# Patient Record
Sex: Female | Born: 1985 | Race: Black or African American | Hispanic: No | Marital: Single | Smoking: Never smoker
Health system: Southern US, Community
[De-identification: ages and names within clinical notes are randomized; demographics above are authoritative.]

## PROBLEM LIST (undated history)

## (undated) DIAGNOSIS — R011 Cardiac murmur, unspecified: Secondary | ICD-10-CM

## (undated) DIAGNOSIS — D649 Anemia, unspecified: Secondary | ICD-10-CM

## (undated) DIAGNOSIS — I10 Essential (primary) hypertension: Secondary | ICD-10-CM

## (undated) HISTORY — PX: NO PAST SURGERIES: SHX2092

## (undated) HISTORY — PX: DILATION AND CURETTAGE OF UTERUS: SHX78

---

## 2007-03-19 ENCOUNTER — Ambulatory Visit: Payer: Self-pay | Admitting: Gynecology

## 2007-03-19 ENCOUNTER — Encounter (INDEPENDENT_AMBULATORY_CARE_PROVIDER_SITE_OTHER): Payer: Self-pay | Admitting: Gynecology

## 2007-03-19 ENCOUNTER — Encounter (INDEPENDENT_AMBULATORY_CARE_PROVIDER_SITE_OTHER): Payer: Self-pay | Admitting: Specialist

## 2007-07-15 ENCOUNTER — Encounter: Payer: Self-pay | Admitting: Obstetrics and Gynecology

## 2007-07-15 ENCOUNTER — Ambulatory Visit: Payer: Self-pay | Admitting: Obstetrics and Gynecology

## 2007-07-15 ENCOUNTER — Other Ambulatory Visit: Admission: RE | Admit: 2007-07-15 | Discharge: 2007-07-15 | Payer: Self-pay | Admitting: Obstetrics and Gynecology

## 2007-08-20 ENCOUNTER — Ambulatory Visit: Payer: Self-pay | Admitting: *Deleted

## 2007-11-01 ENCOUNTER — Inpatient Hospital Stay (HOSPITAL_COMMUNITY): Admission: AD | Admit: 2007-11-01 | Discharge: 2007-11-01 | Payer: Self-pay | Admitting: Obstetrics & Gynecology

## 2008-02-18 ENCOUNTER — Ambulatory Visit: Payer: Self-pay | Admitting: Obstetrics and Gynecology

## 2008-02-18 ENCOUNTER — Encounter: Payer: Self-pay | Admitting: Obstetrics and Gynecology

## 2010-07-20 ENCOUNTER — Emergency Department (HOSPITAL_COMMUNITY): Admission: EM | Admit: 2010-07-20 | Discharge: 2010-07-21 | Payer: Self-pay | Admitting: Emergency Medicine

## 2011-03-01 LAB — DIFFERENTIAL
Basophils Absolute: 0 10*3/uL (ref 0.0–0.1)
Basophils Relative: 0 % (ref 0–1)
Eosinophils Absolute: 0 10*3/uL (ref 0.0–0.7)
Eosinophils Relative: 0 % (ref 0–5)
Lymphs Abs: 0.6 10*3/uL — ABNORMAL LOW (ref 0.7–4.0)
Neutro Abs: 15.1 10*3/uL — ABNORMAL HIGH (ref 1.7–7.7)
Neutrophils Relative %: 93 % — ABNORMAL HIGH (ref 43–77)

## 2011-03-01 LAB — CBC
HCT: 32 % — ABNORMAL LOW (ref 36.0–46.0)
Hemoglobin: 10.5 g/dL — ABNORMAL LOW (ref 12.0–15.0)
MCH: 25.9 pg — ABNORMAL LOW (ref 26.0–34.0)
MCHC: 32.7 g/dL (ref 30.0–36.0)
MCV: 79.1 fL (ref 78.0–100.0)
Platelets: 350 10*3/uL (ref 150–400)
RDW: 16.6 % — ABNORMAL HIGH (ref 11.5–15.5)
WBC: 16.2 10*3/uL — ABNORMAL HIGH (ref 4.0–10.5)

## 2011-03-01 LAB — URINE MICROSCOPIC-ADD ON

## 2011-03-01 LAB — BASIC METABOLIC PANEL
BUN: 10 mg/dL (ref 6–23)
GFR calc Af Amer: >60 mL/min (ref >60)

## 2011-03-01 LAB — URINALYSIS, ROUTINE W REFLEX MICROSCOPIC: Nitrite: NEGATIVE

## 2011-03-01 LAB — GC/CHLAMYDIA PROBE AMP, GENITAL: GC Probe Amp, Genital: NEGATIVE

## 2011-04-30 NOTE — Group Therapy Note (Signed)
NAME:  Kimberly [name], Kimberly [name] NO.:  [id number]   MEDICAL RECORD NO.:  [id number]          PATIENT TYPE:  WOC   LOCATION:  [facility]                   FACILITY:  [facility]   PHYSICIAN:  Argentina Donovan, MD        DATE OF BIRTH:  1986-09-09   DATE OF SERVICE:  02/18/2008                                  CLINIC NOTE   The patient is a 25 year old African American female who underwent  cryosurgery 6 months ago because of a severely abnormal Pap smear.  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  We did conservative cryosurgery because of the patient's age, reporting  it again today, and pending on if it is abnormal, we will probably  follow up with a more radical approach.  If it is normal, we will follow  her up in 6 months with another Pap smear.   IMPRESSION:  Severe cervical dysplasia.           ______________________________  Argentina Donovan, MD     PR/MEDQ  D:  02/18/2008  T:  02/18/2008  Job:  [id number]

## 2011-05-01 IMAGING — CT CT ABD-PELV W/ CM
1 of 2 series · 16 of 32 positions shown, 20 images · IV contrast (agent unspecified)
Comparison: None

CLINICAL DATA: Abdominal pain.

CT ABDOMEN AND PELVIS WITH CONTRAST
TECHNIQUE: Multidetector CT imaging of the abdomen and pelvis was
performed following the standard protocol during bolus
administration of intravenous contrast.
Contrast: 100 ml Imnipaque-FPP IV.

[Series 2: rtn ap with st · axial · 0.72mm/px · z∈[-463,-53]mm · 16 of 90 slices shown, 20 images]
[im 4/90  soft-tissue]
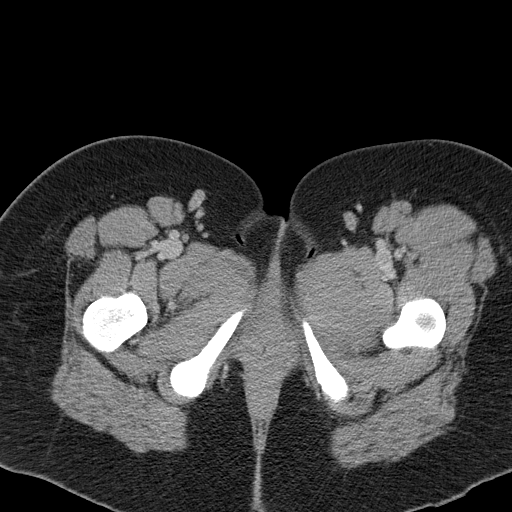
[im 4/90  bone]
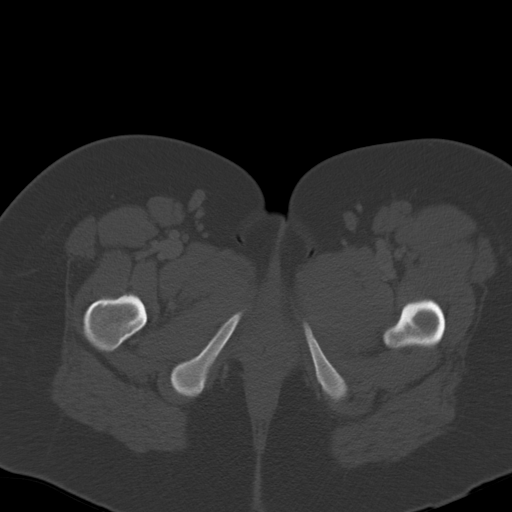
[im 11/90  soft-tissue]
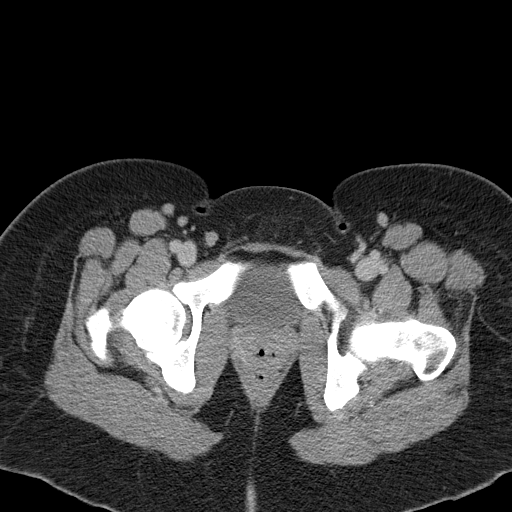
[im 18/90  soft-tissue]
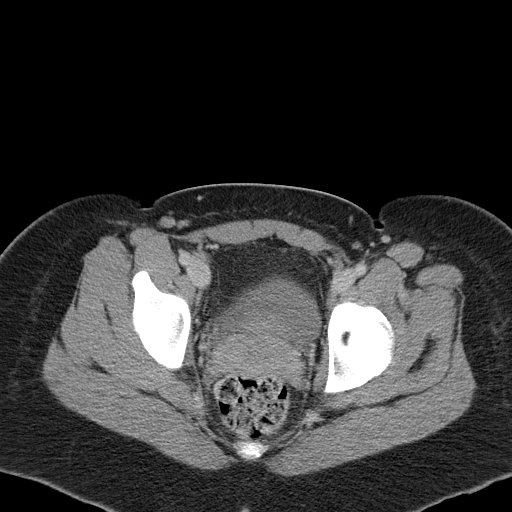
[im 25/90  soft-tissue]
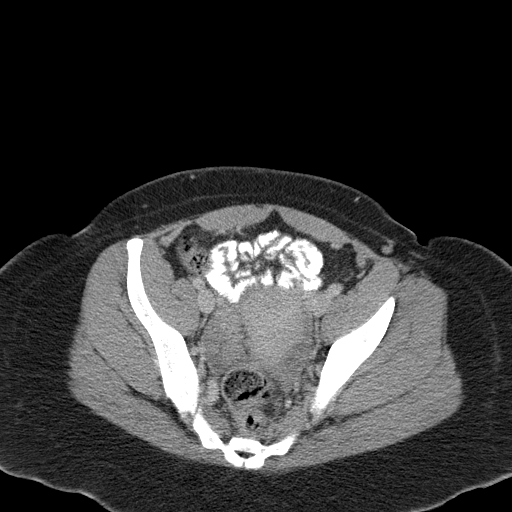
[im 29/90  soft-tissue]
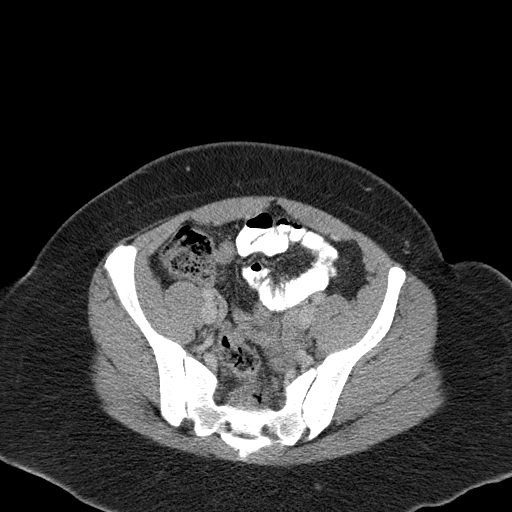
[im 36/90  soft-tissue]
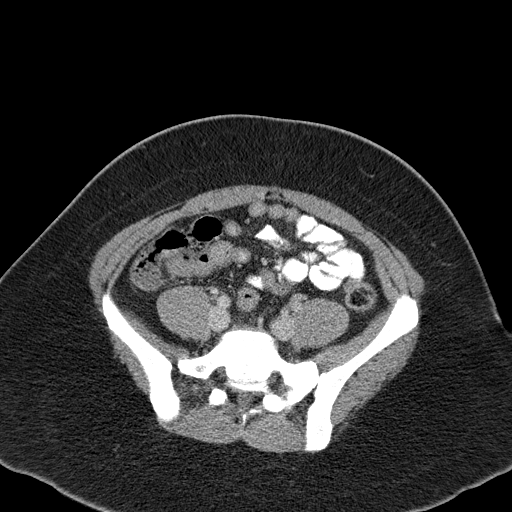
[im 43/90  soft-tissue]
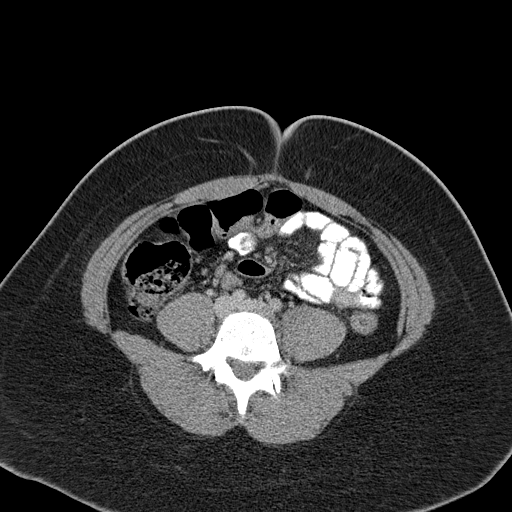
[im 47/90  soft-tissue]
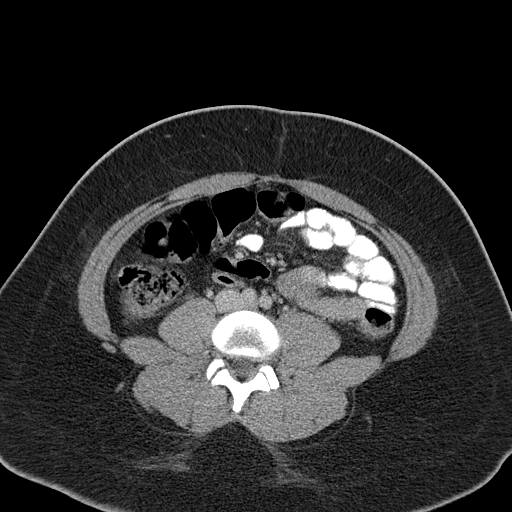
[im 54/90  soft-tissue]
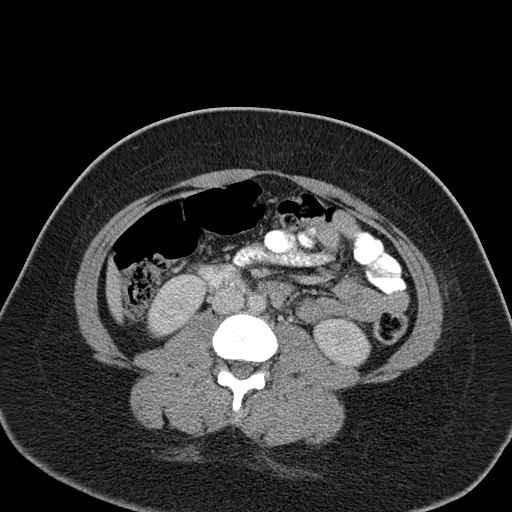
[im 54/90  bone]
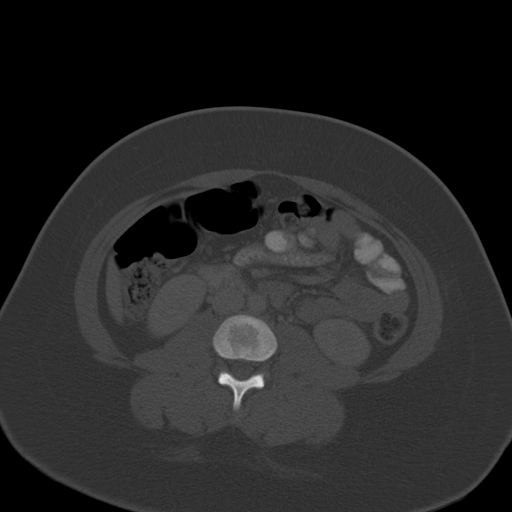
[im 61/90  soft-tissue]
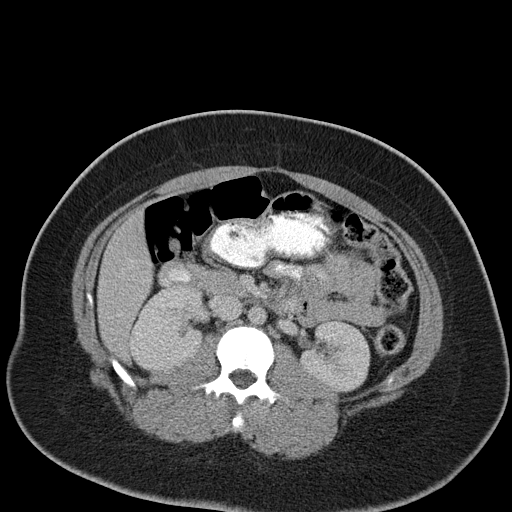
[im 68/90  soft-tissue]
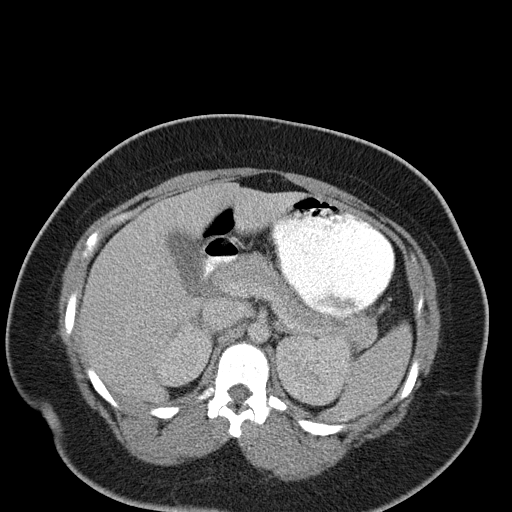
[im 72/90  soft-tissue]
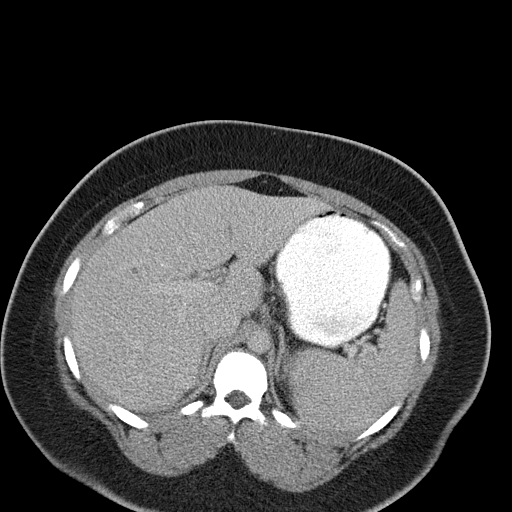
[im 75/90  lung]
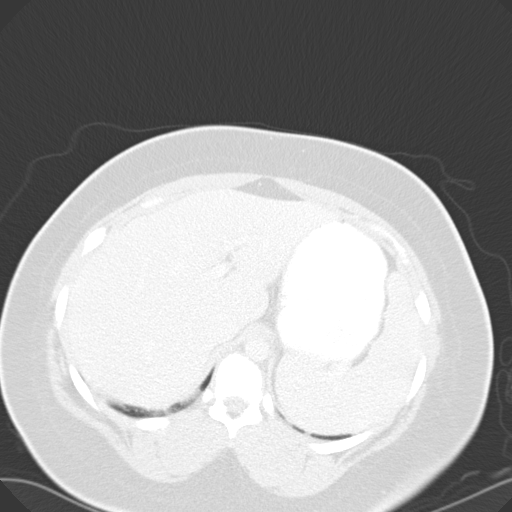
[im 79/90  soft-tissue]
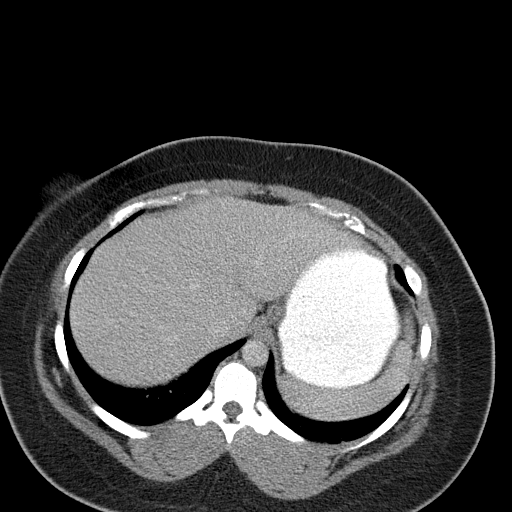
[im 79/90  lung]
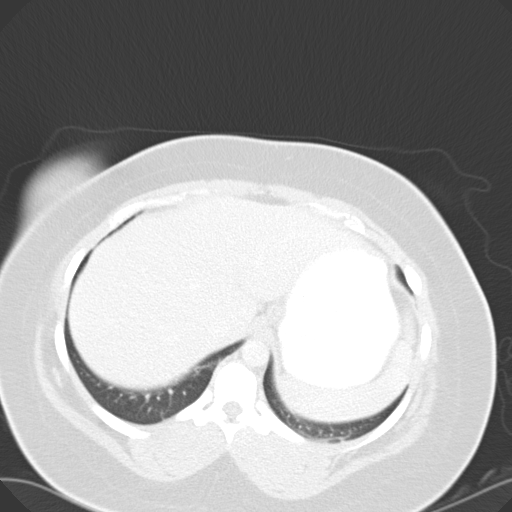
[im 82/90  lung]
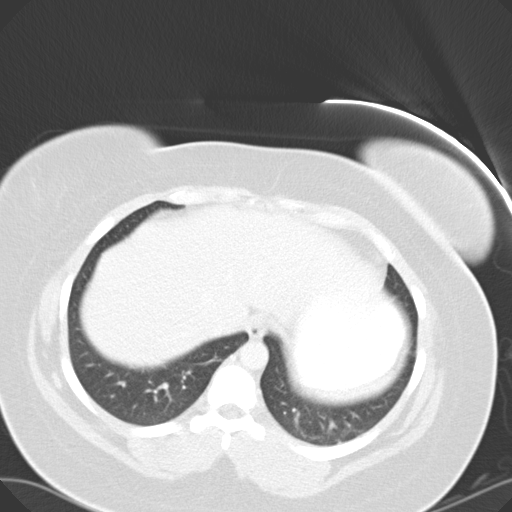
[im 86/90  soft-tissue]
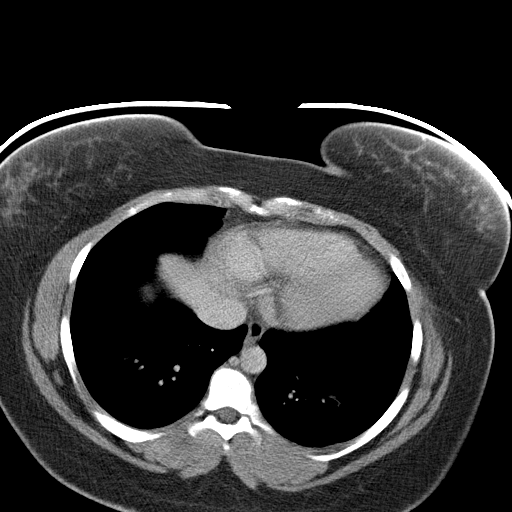
[im 86/90  lung]
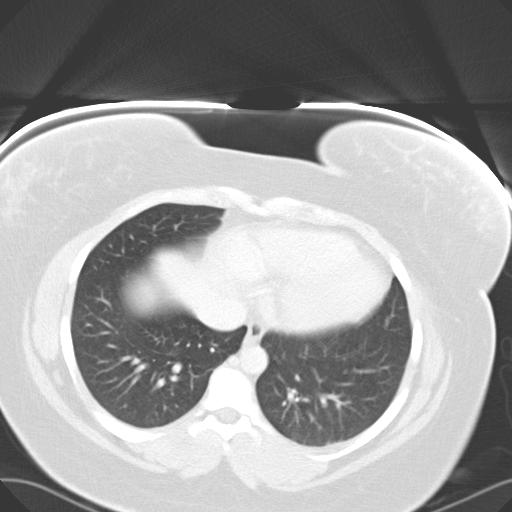

[16 of 32 positions shown; findings below may reference images not displayed]

FINDINGS: The liver and gallbladder normal.  The pancreas spleen
and kidneys are normal.

There is no bowel obstruction or bowel thickening.  The appendix is
normal.

There is mild free fluid in the pelvis surrounding the adnexa
bilaterally.  The uterus is normal in size.  There is no mass
lesion.  No adenopathy is present.
IMPRESSION: Free fluid in the pelvis.  This may be due to a hemorrhagic ovarian
cyst or possibly PID.  Otherwise negative.

## 2011-09-24 LAB — URINALYSIS, ROUTINE W REFLEX MICROSCOPIC
Protein, ur: 30 — AB
Urobilinogen, UA: 0.2
pH: 7

## 2011-09-24 LAB — POCT PREGNANCY, URINE: Operator id: 251141

## 2011-09-24 LAB — URINE MICROSCOPIC-ADD ON: RBC / HPF: NONE SEEN

## 2011-09-30 LAB — POCT PREGNANCY, URINE: Operator id: 297281

## 2012-04-21 ENCOUNTER — Ambulatory Visit: Payer: 59 | Admitting: Physician Assistant

## 2012-04-21 VITALS — BP 119/85 | HR 99 | Temp 98.4°F | Resp 18 | Ht 63.5 in | Wt 254.6 lb

## 2012-04-21 DIAGNOSIS — J069 Acute upper respiratory infection, unspecified: Secondary | ICD-10-CM

## 2012-04-21 DIAGNOSIS — D649 Anemia, unspecified: Secondary | ICD-10-CM

## 2012-04-21 DIAGNOSIS — R109 Unspecified abdominal pain: Secondary | ICD-10-CM

## 2012-04-21 LAB — POCT UA - MICROSCOPIC ONLY: Crystals, Ur, HPF, POC: NEGATIVE

## 2012-04-21 LAB — POCT CBC
Granulocyte percent: 77.7 %G (ref 37–80)
MCV: 74.1 fL — AB (ref 80–97)
RBC: 4.61 M/uL (ref 4.04–5.48)

## 2012-04-21 LAB — POCT URINALYSIS DIPSTICK
Bilirubin, UA: NEGATIVE
Blood, UA: NEGATIVE
Glucose, UA: NEGATIVE
Protein, UA: NEGATIVE
Spec Grav, UA: 1.02
pH, UA: 6.5

## 2012-04-21 MED ORDER — FLUTICASONE PROPIONATE 50 MCG/ACT NA SUSP: 2.0000 | Freq: Every day | NASAL | Status: DC

## 2012-04-21 MED ORDER — NAPROXEN 500 MG PO TABS: 500.0000 mg | ORAL_TABLET | Freq: Two times a day (BID) | ORAL | Status: DC

## 2012-04-21 NOTE — Progress Notes (Signed)
Subjective:    Patient ID: Kimberly [name], female    DOB: 1986-10-08, 26 y.o.   MRN: [id number]  HPI26yo AAF here with severe nasal congestion X3days.  No f/c.  Initially had a slightly ST.  No cough.  +sneezing.  No bodyaches.  Also c/o 2 day h/o L flank pain.  Denies urinary/pelvic s/sx. No new activity. No h/o kidney stones.  Appetite is normal.  No N/V/D.    Review of Systems  All other systems reviewed and are negative.       Objective:   Physical Exam  Nursing note and vitals reviewed. Constitutional: She is oriented to person, place, and time. She appears well-developed and well-nourished.  HENT:  Head: Normocephalic and atraumatic.  Right Ear: External ear normal.  Left Ear: External ear normal.  Mouth/Throat: No oropharyngeal exudate (pnd).       Turbinates inflamed,red, and swollen  Cardiovascular: Normal rate, regular rhythm and normal heart sounds.   Pulmonary/Chest: Effort normal and breath sounds normal. No respiratory distress. She has no wheezes. She has no rales. She exhibits no tenderness.  Abdominal: Soft. Bowel sounds are normal. She exhibits no distension. There is tenderness (mild LUQ midaxillary TTP).  Neurological: She is alert and oriented to person, place, and time.  Skin: Skin is warm and dry.   Results for orders placed in visit on 04/21/12  POCT URINALYSIS DIPSTICK      Component Value Range   Color, UA yellow     Clarity, UA clear     Glucose, UA neg     Bilirubin, UA neg     Ketones, UA neg     Spec Grav, UA 1.020     Blood, UA neg     pH, UA 6.5     Protein, UA neg     Urobilinogen, UA 0.2     Nitrite, UA neg     Leukocytes, UA Negative    POCT UA - MICROSCOPIC ONLY      Component Value Range   WBC, Ur, HPF, POC 0-1     RBC, urine, microscopic neg     Bacteria, U Microscopic trace     Mucus, UA neg     Epithelial cells, urine per micros 0-4     Crystals, Ur, HPF, POC neg     Casts, Ur, LPF, POC neg     Yeast, UA neg    POCT CBC   Component Value Range   WBC 9.8  4.6 - 10.2 (K/uL)   Lymph, poc 1.7  0.6 - 3.4    POC LYMPH PERCENT 16.9  10 - 50 (%L)   MID (cbc) 0.5  0 - 0.9    POC MID % 5.4  0 - 12 (%M)   POC Granulocyte 7.6 (*) 2 - 6.9    Granulocyte percent 77.7  37 - 80 (%G)   RBC 4.61  4.04 - 5.48 (M/uL)   Hemoglobin 10.6 (*) 12.2 - 16.2 (g/dL)   HCT, POC 40.9 (*) 81.1 - 47.9 (%)   MCV 74.1 (*) 80 - 97 (fL)   MCH, POC 23.0 (*) 27 - 31.2 (pg)   MCHC 31.0 (*) 31.8 - 35.4 (g/dL)   RDW, POC 91.4     Platelet Count, POC 393  142 - 424 (K/uL)   MPV 8.6  0 - 99.8 (fL)          Assessment & Plan:  URI-viral Anemia-start slow Fe 1 daily.  Recheck in 8-10 weeks.  Increase water intake. L flank pain-non acute abdomen-likely musculoskeletal or splenic flexure syndrome. See meds.  Also advised mucinex D

## 2012-06-30 ENCOUNTER — Ambulatory Visit (INDEPENDENT_AMBULATORY_CARE_PROVIDER_SITE_OTHER): Payer: 59 | Admitting: Physician Assistant

## 2012-06-30 VITALS — BP 132/88 | HR 80 | Temp 98.5°F | Resp 16 | Ht 64.0 in | Wt 262.0 lb

## 2012-06-30 DIAGNOSIS — E669 Obesity, unspecified: Secondary | ICD-10-CM

## 2012-06-30 DIAGNOSIS — R51 Headache: Secondary | ICD-10-CM

## 2012-06-30 MED ORDER — PREDNISONE 10 MG PO TABS: ORAL_TABLET | ORAL | Status: DC

## 2012-06-30 NOTE — Progress Notes (Signed)
  Subjective:    Patient ID: Kimberly [name], female    DOB: 20-Mar-1986, 26 y.o.   MRN: [id number]  HPI  Pt presents to clinic with concerns regarding daily HAs.  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  They are mainly L sided behind the eye without nausea but with photophobia.  She gets 4-5 each week and many of them send her home from work.  She has gone home both days this week.  She takes Advil for the headaches (5-7 at a time) most days and gets about 30 mins of relief and then the HA returns sometimes worse.  She most days goes to bed with HA and then wakes up with the pain also.  She wears glasses for computer work but does not always do it - she thought that might be a problem so she started to wear her glasses but that did not make much of a difference - she rarely eats breakfast and does not feel the HAs are related to her eating or not eating.  They are not related to her menses.  Her mother has H/o migraines.  She has never been evaluated for these HAs but before 3 months ago did not have them.  Has h/o seasonal allergies was on Nasonex but stopped that about 1 month ago and her allergies have stayed in control.   Review of Systems  Constitutional: Negative for fever and chills.  Eyes: Positive for photophobia.  Gastrointestinal: Negative for nausea.  Neurological: Positive for headaches. Negative for dizziness and weakness.       Objective:   Physical Exam  Vitals reviewed. Constitutional: She is oriented to person, place, and time. She appears well-developed and well-nourished.  HENT:  Head: Normocephalic and atraumatic.  Right Ear: External ear normal.  Left Ear: External ear normal.  Nose: Nose normal.  Mouth/Throat: Oropharynx is clear and moist.  Eyes: Conjunctivae and EOM are normal. Pupils are equal, round, and reactive to light.  Neck: Normal range of motion. Neck supple.    Cardiovascular: Normal rate, regular rhythm and normal heart sounds.  Exam reveals no gallop and no friction rub.   No murmur heard. Pulmonary/Chest: Effort normal and breath sounds normal.  Neurological: She is alert and oriented to person, place, and time. She has normal reflexes.  Skin: Skin is warm and dry.  Psychiatric: She has a normal mood and affect. Her behavior is normal. Judgment and thought content normal.          Assessment & Plan:   1. Persistent headaches  predniSONE (DELTASONE) 10 MG tablet   D/w pt at length daily chronic HA and the different ways to treat.  We decided to try Advil complete withdraw for at least 2 wks and do a prednisone taper to help with HAs.  Then will start a HA diary.  I feel like these HAs are probably multifactorial - work, vision, migraines overuse of medication.  If no improvement in HA after withdraw will start topamax.  Pt understood and agreed with the above plan.

## 2013-01-04 ENCOUNTER — Ambulatory Visit (INDEPENDENT_AMBULATORY_CARE_PROVIDER_SITE_OTHER): Payer: Managed Care, Other (non HMO) | Admitting: Family Medicine

## 2013-01-04 VITALS — BP 138/84 | HR 101 | Temp 98.5°F | Resp 16 | Ht 63.0 in | Wt 251.0 lb

## 2013-01-04 DIAGNOSIS — Z0000 Encounter for general adult medical examination without abnormal findings: Secondary | ICD-10-CM

## 2013-01-04 LAB — POCT URINALYSIS DIPSTICK
Bilirubin, UA: NEGATIVE
Blood, UA: NEGATIVE
Glucose, UA: NEGATIVE
Leukocytes, UA: NEGATIVE
Nitrite, UA: NEGATIVE
Protein, UA: 30
Spec Grav, UA: >=1.03
Urobilinogen, UA: 0.2
pH, UA: 5.5

## 2013-01-04 LAB — POCT CBC
Granulocyte percent: 73.4 %G (ref 37–80)
HCT, POC: 32.7 % — AB (ref 37.7–47.9)
Hemoglobin: 9.7 g/dL — AB (ref 12.2–16.2)
Lymph, poc: 2.2 (ref 0.6–3.4)
MCH, POC: 22.7 pg — AB (ref 27–31.2)
MCHC: 29.7 g/dL — AB (ref 31.8–35.4)
MCV: 76.6 fL — AB (ref 80–97)
MID (cbc): 0.5 (ref 0–0.9)
MPV: 8.1 fL (ref 0–99.8)
POC Granulocyte: 7.3 — AB (ref 2–6.9)
POC LYMPH PERCENT: 21.8 %L (ref 10–50)
POC MID %: 4.8 %M (ref 0–12)
Platelet Count, POC: 465 10*3/uL — AB (ref 142–424)
RBC: 4.27 M/uL (ref 4.04–5.48)
RDW, POC: 15.4 %
WBC: 9.9 10*3/uL (ref 4.6–10.2)

## 2013-01-04 NOTE — Progress Notes (Signed)
Patient ID: Kimberly [name] MRN: [id number], DOB: August 26, 1986, 27 y.o. Date of Encounter: 01/04/2013, 5:13 PM  Primary Physician: No primary provider on file.  Chief Complaint: Physical (CPE)  HPI: 27 y.o. y/o female with history of noted below here for CPE.  Doing well. No issues/complaints. Works at call center Works out at [employer] up to 2 days a week  LMP: December 29, 2012 Pap: year ago  Review of Systems: Consitutional: No fever, chills, fatigue, night sweats, lymphadenopathy, or weight changes. Eyes: No visual changes, eye redness, or discharge. ENT/Mouth: Ears: No otalgia, tinnitus, hearing loss, discharge. Nose: No congestion, rhinorrhea, sinus pain, or epistaxis. Throat: No sore throat, post nasal drip, or teeth pain. Cardiovascular: No CP, palpitations, diaphoresis, DOE, edema, orthopnea, PND. Respiratory: No cough, hemoptysis, SOB, or wheezing. Gastrointestinal: No anorexia, dysphagia, reflux, pain, nausea, vomiting, hematemesis, diarrhea, constipation, BRBPR, or melena. Breast: No discharge, pain, swelling, or mass. Genitourinary: No dysuria, frequency, urgency, hematuria, incontinence, nocturia, amenorrhea, vaginal discharge, pruritis, burning, abnormal bleeding, or pain. Musculoskeletal: No decreased ROM, myalgias, stiffness, joint swelling, or weakness. Skin: No rash, erythema, lesion changes, pain, warmth, jaundice, or pruritis. Neurological: No headache, dizziness, syncope, seizures, tremors, memory loss, coordination problems, or paresthesias. Psychological: No anxiety, depression, hallucinations, SI/HI. Endocrine: No fatigue, polydipsia, polyphagia, polyuria, or known diabetes. All other systems were reviewed and are otherwise negative.  History reviewed. No pertinent past medical history.   History reviewed. No pertinent past surgical history.  Home Meds:  Prior to Admission medications   Medication Sig Start Date End Date Taking? Authorizing Provider    fluticasone (FLONASE) 50 MCG/ACT nasal spray Place 2 sprays into the nose daily. 04/21/12 04/21/13  Anders Simmonds, PA-C  HPV Quadrivalent Vaccine (GARDASIL IM) Inject into the muscle.    Historical Provider, MD  naproxen (NAPROSYN) 500 MG tablet Take 1 tablet (500 mg total) by mouth 2 (two) times daily with a meal. X5days then prn pain 04/21/12 04/21/13  Anders Simmonds, PA-C  predniSONE (DELTASONE) 10 MG tablet 6-1 taper - take the days meds early in day all at once with food 06/30/12   Morrell Riddle, PA-C    Allergies: No Known Allergies  History   Social History  . Marital Status: Single    Spouse Name: N/A    Number of Children: N/A  . Years of Education: N/A   Occupational History  . Not on file.   Social History Main Topics  . Smoking status: Never Smoker   . Smokeless tobacco: Not on file  . Alcohol Use: Not on file  . Drug Use: Not on file  . Sexually Active: Not on file   Other Topics Concern  . Not on file   Social History Narrative  . No narrative on file    History reviewed. No pertinent family history.  Physical Exam: Blood pressure 138/84, pulse 101, temperature 98.5 F (36.9 C), temperature source Oral, resp. rate 16, height 5\' 3"  (1.6 m), weight 251 lb (113.853 kg), last menstrual period 12/29/2012, SpO2 96.00%., Body mass index is 44.46 kg/(m^2). General: Well developed, well nourished, in no acute distress. HEENT: Normocephalic, atraumatic. Conjunctiva pink, sclera non-icteric. Pupils 2 mm constricting to 1 mm, round, regular, and equally reactive to light and accomodation. EOMI. Internal auditory canal clear. TMs with good cone of light and without pathology. Nasal mucosa pink. Nares are without discharge. No sinus tenderness. Oral mucosa pink. Dentition good. Pharynx without exudate.   Neck: Supple. Trachea midline. No thyromegaly. Full  ROM. No lymphadenopathy. Lungs: Clear to auscultation bilaterally without wheezes, rales, or rhonchi. Breathing is of normal  effort and unlabored. Cardiovascular: RRR with S1 S2. No murmurs, rubs, or gallops appreciated. Distal pulses 2+ symmetrically. No carotid or abdominal bruits. Breast: Symmetrical. No masses. Nipples without discharge. Abdomen: Soft, non-tender, non-distended with normoactive bowel sounds. No hepatosplenomegaly or masses. No rebound/guarding. No CVA tenderness. Without hernias.  Genitourinary:  External genitalia without lesions. Vaginal mucosa pink. Cervix pink and without discharge. No cervical or adnexal tenderness. Pap smear taken Musculoskeletal: Full range of motion and 5/5 strength throughout. Without swelling, atrophy, tenderness, crepitus, or warmth. Extremities without clubbing, cyanosis, or edema. Calves supple. Skin: Warm and moist without erythema, ecchymosis, wounds, or rash. Neuro: A+Ox3. CN II-XII grossly intact. Moves all extremities spontaneously. Full sensation throughout. Normal gait. DTR 2+ throughout upper and lower extremities. Finger to nose intact. Psych:  Responds to questions appropriately with a normal affect.   Studies: CBC, CMET, Lipid UA:  Results for orders placed in visit on 01/04/13  POCT CBC      Component Value Range   WBC 9.9  4.6 - 10.2 K/uL   Lymph, poc 2.2  0.6 - 3.4   POC LYMPH PERCENT 21.8  10 - 50 %L   MID (cbc) 0.5  0 - 0.9   POC MID % 4.8  0 - 12 %M   POC Granulocyte 7.3 (*) 2 - 6.9   Granulocyte percent 73.4  37 - 80 %G   RBC 4.27  4.04 - 5.48 M/uL   Hemoglobin 9.7 (*) 12.2 - 16.2 g/dL   HCT, POC 40.9 (*) 81.1 - 47.9 %   MCV 76.6 (*) 80 - 97 fL   MCH, POC 22.7 (*) 27 - 31.2 pg   MCHC 29.7 (*) 31.8 - 35.4 g/dL   RDW, POC 91.4     Platelet Count, POC 465 (*) 142 - 424 K/uL   MPV 8.1  0 - 99.8 fL  POCT URINALYSIS DIPSTICK      Component Value Range   Color, UA yellow     Clarity, UA cloudy     Glucose, UA neg     Bilirubin, UA neg     Ketones, UA trace     Spec Grav, UA >=1.030     Blood, UA neg     pH, UA 5.5     Protein, UA 30      Urobilinogen, UA 0.2     Nitrite, UA neg     Leukocytes, UA Negative       Assessment/Plan:  27 y.o. y/o female here for CPE Overweight, but otherwise healthy 1. Annual physical exam  POCT CBC, Comprehensive metabolic panel, Lipid panel, Pap IG, CT/NG w/ reflex HPV when ASC-U, POCT urinalysis dipstick    -  Signed, Elvina Sidle, MD 01/04/2013 5:13 PM

## 2013-01-04 NOTE — Patient Instructions (Addendum)
Obesity Obesity is defined as having too much total body fat and a body mass index (BMI) of 30 or more. BMI is an estimate of body fat and is calculated from your height and weight. Obesity happens when you consume more calories than you can burn by exercising or performing daily physical tasks. Prolonged obesity can cause major illnesses or emergencies, such as:   A stroke.  Heart disease.  Diabetes.  Cancer.  Arthritis.  High blood pressure (hypertension).  High cholesterol.  Sleep apnea.  Erectile dysfunction.  Infertility problems. CAUSES   Regularly eating unhealthy foods.  Physical inactivity.  Certain disorders, such as an underactive thyroid (hypothyroidism), Cushing's syndrome, and polycystic ovarian syndrome.  Certain medicines, such as steroids, some depression medicines, and antipsychotics.  Genetics.  Lack of sleep. DIAGNOSIS  A caregiver can diagnose obesity after calculating your BMI. Obesity will be diagnosed if your BMI is 30 or higher.  There are other methods of measuring obesity levels. Some other methods include measuring your skin fold thickness, your waist circumference, and comparing your hip circumference to your waist circumference. TREATMENT  A healthy treatment program includes some or all of the following:  Long-term dietary changes.  Exercise and physical activity.  Behavioral and lifestyle changes.  Medicine only under the supervision of your caregiver. Medicines may help, but only if they are used with diet and exercise programs. An unhealthy treatment program includes:  Fasting.  Fad diets.  Supplements and drugs. These choices do not succeed in long-term weight control.  HOME CARE INSTRUCTIONS   Exercise and perform physical activity as directed by your caregiver. To increase physical activity, try the following:  Use stairs instead of elevators.  Park farther away from store entrances.  Garden, bike, or walk instead of  watching television or using the computer.  Eat healthy, low-calorie foods and drinks on a regular basis. Eat more fruits and vegetables. Use low-calorie cookbooks or take healthy cooking classes.  Limit fast food, sweets, and processed snack foods.  Eat smaller portions.  Keep a daily journal of everything you eat. There are many free websites to help you with this. It may be helpful to measure your foods so you can determine if you are eating the correct portion sizes.  Avoid drinking alcohol. Drink more water and drinks without calories.  Take vitamins and supplements only as recommended by your caregiver.  Weight-loss support groups, Optometrist, counselors, and stress reduction education can also be very helpful. SEEK IMMEDIATE MEDICAL CARE IF:  You have chest pain or tightness.  You have trouble breathing or feel short of breath.  You have weakness or leg numbness.  You feel confused or have trouble talking.  You have sudden changes in your vision. MAKE SURE YOU:  Understand these instructions.  Will watch your condition.  Will get help right away if you are not doing well or get worse. Document Released: 01/09/2005 Document Revised: 06/02/2012 Document Reviewed: 01/08/2012 [facility] Patient Information 2013 [place], [place]. Health Maintenance, Females A healthy lifestyle and preventative care can promote health and wellness.  Maintain regular health, dental, and eye exams.  Eat a healthy diet. Foods like vegetables, fruits, whole grains, low-fat dairy products, and lean protein foods contain the nutrients you need without too many calories. Decrease your intake of foods high in solid fats, added sugars, and salt. Get information about a proper diet from your caregiver, if necessary.  Regular physical exercise is one of the most important things you can do for  your health. Most adults should get at least 150 minutes of moderate-intensity exercise (any activity  that increases your heart rate and causes you to sweat) each week. In addition, most adults need muscle-strengthening exercises on 2 or more days a week.   Maintain a healthy weight. The body mass index (BMI) is a screening tool to identify possible weight problems. It provides an estimate of body fat based on height and weight. Your caregiver can help determine your BMI, and can help you achieve or maintain a healthy weight. For adults 20 years and older:  A BMI below 18.5 is considered underweight.  A BMI of 18.5 to 24.9 is normal.  A BMI of 25 to 29.9 is considered overweight.  A BMI of 30 and above is considered obese.  Maintain normal blood lipids and cholesterol by exercising and minimizing your intake of saturated fat. Eat a balanced diet with plenty of fruits and vegetables. Blood tests for lipids and cholesterol should begin at age 29 and be repeated every 5 years. If your lipid or cholesterol levels are high, you are over 50, or you are a high risk for heart disease, you may need your cholesterol levels checked more frequently.Ongoing high lipid and cholesterol levels should be treated with medicines if diet and exercise are not effective.  If you smoke, find out from your caregiver how to quit. If you do not use tobacco, do not start.  If you are pregnant, do not drink alcohol. If you are breastfeeding, be very cautious about drinking alcohol. If you are not pregnant and choose to drink alcohol, do not exceed 1 drink per day. One drink is considered to be 12 ounces (355 mL) of beer, 5 ounces (148 mL) of wine, or 1.5 ounces (44 mL) of liquor.  Avoid use of street drugs. Do not share needles with anyone. Ask for help if you need support or instructions about stopping the use of drugs.  High blood pressure causes heart disease and increases the risk of stroke. Blood pressure should be checked at least every 1 to 2 years. Ongoing high blood pressure should be treated with medicines, if  weight loss and exercise are not effective.  If you are 59 to 26 years old, ask your caregiver if you should take aspirin to prevent strokes.  Diabetes screening involves taking a blood sample to check your fasting blood sugar level. This should be done once every 3 years, after age 58, if you are within normal weight and without risk factors for diabetes. Testing should be considered at a younger age or be carried out more frequently if you are overweight and have at least 1 risk factor for diabetes.  Breast cancer screening is essential preventative care for women. You should practice "breast self-awareness." This means understanding the normal appearance and feel of your breasts and may include breast self-examination. Any changes detected, no matter how small, should be reported to a caregiver. Women in their 29s and 30s should have a clinical breast exam (CBE) by a caregiver as part of a regular health exam every 1 to 3 years. After age 11, women should have a CBE every year. Starting at age 18, women should consider having a mammogram (breast X-ray) every year. Women who have a family history of breast cancer should talk to their caregiver about genetic screening. Women at a high risk of breast cancer should talk to their caregiver about having an MRI and a mammogram every year.  The Pap test  is a screening test for cervical cancer. Women should have a Pap test starting at age 14. Between ages 4 and 36, Pap tests should be repeated every 2 years. Beginning at age 68, you should have a Pap test every 3 years as long as the past 3 Pap tests have been normal. If you had a hysterectomy for a problem that was not cancer or a condition that could lead to cancer, then you no longer need Pap tests. If you are between ages 25 and 9, and you have had normal Pap tests going back 10 years, you no longer need Pap tests. If you have had past treatment for cervical cancer or a condition that could lead to cancer,  you need Pap tests and screening for cancer for at least 20 years after your treatment. If Pap tests have been discontinued, risk factors (such as a new sexual partner) need to be reassessed to determine if screening should be resumed. Some women have medical problems that increase the chance of getting cervical cancer. In these cases, your caregiver may recommend more frequent screening and Pap tests.  The human papillomavirus (HPV) test is an additional test that may be used for cervical cancer screening. The HPV test looks for the virus that can cause the cell changes on the cervix. The cells collected during the Pap test can be tested for HPV. The HPV test could be used to screen women aged 34 years and older, and should be used in women of any age who have unclear Pap test results. After the age of 70, women should have HPV testing at the same frequency as a Pap test.  Colorectal cancer can be detected and often prevented. Most routine colorectal cancer screening begins at the age of 48 and continues through age 76. However, your caregiver may recommend screening at an earlier age if you have risk factors for colon cancer. On a yearly basis, your caregiver may provide home test kits to check for hidden blood in the stool. Use of a small camera at the end of a tube, to directly examine the colon (sigmoidoscopy or colonoscopy), can detect the earliest forms of colorectal cancer. Talk to your caregiver about this at age 74, when routine screening begins. Direct examination of the colon should be repeated every 5 to 10 years through age 67, unless early forms of pre-cancerous polyps or small growths are found.  Hepatitis C blood testing is recommended for all people born from [birth year] through 1964 and any individual with known risks for hepatitis C.  Practice safe sex. Use condoms and avoid high-risk sexual practices to reduce the spread of sexually transmitted infections (STIs). Sexually active women aged 57  and younger should be checked for Chlamydia, which is a common sexually transmitted infection. Older women with new or multiple partners should also be tested for Chlamydia. Testing for other STIs is recommended if you are sexually active and at increased risk.  Osteoporosis is a disease in which the bones lose minerals and strength with aging. This can result in serious bone fractures. The risk of osteoporosis can be identified using a bone density scan. Women ages 93 and over and women at risk for fractures or osteoporosis should discuss screening with their caregivers. Ask your caregiver whether you should be taking a calcium supplement or vitamin D to reduce the rate of osteoporosis.  Menopause can be associated with physical symptoms and risks. Hormone replacement therapy is available to decrease symptoms and risks. You should  talk to your caregiver about whether hormone replacement therapy is right for you.  Use sunscreen with a sun protection factor (SPF) of 30 or greater. Apply sunscreen liberally and repeatedly throughout the day. You should seek shade when your shadow is shorter than you. Protect yourself by wearing long sleeves, pants, a wide-brimmed hat, and sunglasses year round, whenever you are outdoors.  Notify your caregiver of new moles or changes in moles, especially if there is a change in shape or color. Also notify your caregiver if a mole is larger than the size of a pencil eraser.  Stay current with your immunizations. Document Released: 06/17/2011 Document Revised: 02/24/2012 Document Reviewed: 06/17/2011 [facility] Patient Information 2013 [place], [place].

## 2013-01-05 LAB — COMPREHENSIVE METABOLIC PANEL
ALT: 13 U/L (ref 0–35)
AST: 13 U/L (ref 0–37)
Albumin: 4.3 g/dL (ref 3.5–5.2)
Alkaline Phosphatase: 109 U/L (ref 39–117)
BUN: 12 mg/dL (ref 6–23)
CO2: 22 mEq/L (ref 19–32)
Calcium: 9.3 mg/dL (ref 8.4–10.5)
Chloride: 105 mEq/L (ref 96–112)
Creat: 0.8 mg/dL (ref 0.50–1.10)
Glucose, Bld: 87 mg/dL (ref 70–99)
Potassium: 4.1 mEq/L (ref 3.5–5.3)
Sodium: 136 mEq/L (ref 135–145)
Total Bilirubin: 0.2 mg/dL — ABNORMAL LOW (ref 0.3–1.2)
Total Protein: 7.8 g/dL (ref 6.0–8.3)

## 2013-01-05 LAB — LIPID PANEL
Cholesterol: 159 mg/dL (ref 0–200)
HDL: 43 mg/dL (ref >39)
LDL Cholesterol: 100 mg/dL — ABNORMAL HIGH (ref 0–99)
Total CHOL/HDL Ratio: 3.7 Ratio
Triglycerides: 78 mg/dL (ref <150)
VLDL: 16 mg/dL (ref 0–40)

## 2013-01-06 ENCOUNTER — Telehealth: Payer: Self-pay

## 2013-01-06 NOTE — Telephone Encounter (Signed)
Dr. Elbert Ewings, when I spoke with pt about her labs, she asked about what might be causing her stomach pains that she mentioned, but I didn't see anything in your note about it. Please advise.

## 2013-01-07 NOTE — Telephone Encounter (Signed)
I felt the abdominal discomfort was most consistent with constipation.  If this is continuing to be bothersome, I would recommend Miralax, 1 capful in 8 oz water, qhs for 5 days.  If pain persists, then we'll get an ultrasound

## 2013-01-07 NOTE — Telephone Encounter (Signed)
Pt.notified

## 2013-01-07 NOTE — Telephone Encounter (Signed)
LMOM to CB. 

## 2013-02-15 ENCOUNTER — Other Ambulatory Visit: Payer: Self-pay | Admitting: Radiology

## 2013-02-15 MED ORDER — METRONIDAZOLE 500 MG PO TABS: 500.0000 mg | ORAL_TABLET | Freq: Two times a day (BID) | ORAL | Status: DC

## 2013-02-15 NOTE — Telephone Encounter (Signed)
Dr Dareen Piano has gotten Pap, patient has untreated BV meds sent in for her.

## 2013-03-18 ENCOUNTER — Ambulatory Visit: Payer: Managed Care, Other (non HMO) | Admitting: Physician Assistant

## 2013-03-18 VITALS — BP 122/84 | HR 116 | Temp 98.8°F | Resp 20 | Ht 64.0 in | Wt 251.0 lb

## 2013-03-18 DIAGNOSIS — R590 Localized enlarged lymph nodes: Secondary | ICD-10-CM

## 2013-03-18 DIAGNOSIS — J020 Streptococcal pharyngitis: Secondary | ICD-10-CM

## 2013-03-18 DIAGNOSIS — J029 Acute pharyngitis, unspecified: Secondary | ICD-10-CM

## 2013-03-18 DIAGNOSIS — J309 Allergic rhinitis, unspecified: Secondary | ICD-10-CM

## 2013-03-18 DIAGNOSIS — R599 Enlarged lymph nodes, unspecified: Secondary | ICD-10-CM

## 2013-03-18 DIAGNOSIS — J302 Other seasonal allergic rhinitis: Secondary | ICD-10-CM

## 2013-03-18 DIAGNOSIS — D649 Anemia, unspecified: Secondary | ICD-10-CM

## 2013-03-18 LAB — POCT CBC
Granulocyte percent: 81.4 %G — AB (ref 37–80)
HCT, POC: 27.1 % — AB (ref 37.7–47.9)
MCH, POC: 22.6 pg — AB (ref 27–31.2)
MCHC: 30.6 g/dL — AB (ref 31.8–35.4)
MCV: 73.7 fL — AB (ref 80–97)
POC Granulocyte: 9 — AB (ref 2–6.9)
POC LYMPH PERCENT: 13.8 %L (ref 10–50)
RDW, POC: 17.8 %

## 2013-03-18 MED ORDER — MAGIC MOUTHWASH W/LIDOCAINE: ORAL | Status: DC

## 2013-03-18 MED ORDER — IBUPROFEN 800 MG PO TABS: 800.0000 mg | ORAL_TABLET | Freq: Three times a day (TID) | ORAL | Status: DC | PRN

## 2013-03-18 MED ORDER — CETIRIZINE HCL 10 MG PO TABS: 10.0000 mg | ORAL_TABLET | Freq: Every day | ORAL | Status: DC

## 2013-03-18 NOTE — Patient Instructions (Addendum)
If lymph nodes are not better in 2 wks return for reheck. We are looking at your anemia and I will call you with these results to determine the next step.

## 2013-03-18 NOTE — Progress Notes (Signed)
[address] [place] [address]   Phone [phone]  Subjective:    Patient ID: Kimberly [name], female    DOB: [birth year], 27 y.o.   MRN: [id number]  HPI Pt presents to clinic with 4 day h/o ST and ear pain on the R side.  She has no cold symptoms and no lesions on her scalp.  Her hair is braided but it was not done recently. She has no congestion or cough or PND that he knows of.  She has not been exposed to strep throat that she knows of.  She has had decreased appetite today mainly because it hurts so bad to swallow.  She has never had problems with allergies.   After her CBC was returned and her anemia was discussed - pt states she has heavy menses 6-7 days with heavy flow at least 1 box a month sometimes 2 - changes 6 times a day.  She is cold all the time, fatigued and no energy.  She has never been evaluated for her anemia or her heavy menses.    Review of Systems  Constitutional: Negative for fever and chills.  HENT: Positive for ear pain (R side) and sore throat (R side only). Negative for hearing loss, congestion and ear discharge.   Respiratory: Negative for cough.   Gastrointestinal: Negative for nausea, vomiting and diarrhea.  Musculoskeletal: Negative for myalgias.  Neurological: Negative for headaches.       Objective:   Physical Exam  Vitals reviewed. Constitutional: She is oriented to person, place, and time. She appears well-developed and well-nourished.  HENT:  Head: Normocephalic and atraumatic.  Right Ear: Hearing, tympanic membrane, external ear and ear canal normal.  Left Ear: Hearing, tympanic membrane, external ear and ear canal normal.  Nose: Mucosal edema (pale) present.  Mouth/Throat: Uvula is midline, oropharynx is clear and moist and mucous membranes are normal.  Eyes: Conjunctivae are normal.  Cardiovascular: Normal rate, regular rhythm and normal heart sounds.   No murmur heard. Pulmonary/Chest: Effort normal and breath sounds normal.    Neurological: She is alert and oriented to person, place, and time.  Skin: Skin is warm and dry.  Psychiatric: She has a normal mood and affect. Her behavior is normal. Judgment and thought content normal.   Results for orders placed in visit on 03/18/13  POCT CBC      Result Value Range   WBC 11.0 (*) 4.6 - 10.2 K/uL   Lymph, poc 1.5  0.6 - 3.4   POC LYMPH PERCENT 13.8  10 - 50 %L   MID (cbc) 0.5  0 - 0.9   POC MID % 4.8  0 - 12 %M   POC Granulocyte 9.0 (*) 2 - 6.9   Granulocyte percent 81.4 (*) 37 - 80 %G   RBC 3.68 (*) 4.04 - 5.48 M/uL   Hemoglobin 8.3 (*) 12.2 - 16.2 g/dL   HCT, POC 46.9 (*) 62.9 - 47.9 %   MCV 73.7 (*) 80 - 97 fL   MCH, POC 22.6 (*) 27 - 31.2 pg   MCHC 30.6 (*) 31.8 - 35.4 g/dL   RDW, POC 52.8     Platelet Count, POC 343  142 - 424 K/uL   MPV 8.5  0 - 99.8 fL         Assessment & Plan:  Lymphadenopathy of right cervical region - Believe reactionary. Most likely PND from allergies.  Plan: POCT CBC, ibuprofen (ADVIL,MOTRIN) 800 MG tablet.  If still having  problems will RTC in 2 wks for recheck CBC/  Anemia - Pt has never been worked up for [employer] [facility].  Today we will check her iron studies and then if normal will consider hemoglobin electrophoresis (pt has no h/o sickle cell trait or disease).  If all that is normal which I expect this is probably related to heavy menstrual cycle and she should be either started on OCP for menses control or referred to GYN.  -Plan: Iron and TIBC, Ferritin  sore throat - Plan: POCT rapid strep A - no strep throat - related to PND from allergies and lymph node pain. Ear pain is referred from lymph node.  Pt will plan f/u for anemia work-up.

## 2013-03-19 LAB — IRON AND TIBC
Iron: 22 ug/dL — ABNORMAL LOW (ref 42–145)
TIBC: 386 ug/dL (ref 250–470)
UIBC: 364 ug/dL (ref 125–400)

## 2013-03-22 ENCOUNTER — Ambulatory Visit: Payer: Managed Care, Other (non HMO) | Admitting: Physician Assistant

## 2013-03-22 VITALS — BP 137/85 | HR 93 | Temp 99.3°F | Resp 16 | Ht 63.5 in | Wt 254.4 lb

## 2013-03-22 DIAGNOSIS — K143 Hypertrophy of tongue papillae: Secondary | ICD-10-CM

## 2013-03-22 DIAGNOSIS — B370 Candidal stomatitis: Secondary | ICD-10-CM

## 2013-03-22 LAB — POCT CBC
Granulocyte percent: 74.8 %G (ref 37–80)
Hemoglobin: 9.5 g/dL — AB (ref 12.2–16.2)
MCHC: 29.1 g/dL — AB (ref 31.8–35.4)
MPV: 8.4 fL (ref 0–99.8)
POC Granulocyte: 7 — AB (ref 2–6.9)
POC MID %: 5.6 %M (ref 0–12)
RBC: 4.36 M/uL (ref 4.04–5.48)

## 2013-03-22 LAB — POCT SKIN KOH: Skin KOH, POC: NEGATIVE

## 2013-03-22 LAB — GLUCOSE, POCT (MANUAL RESULT ENTRY): POC Glucose: 89 mg/dl (ref 70–99)

## 2013-03-22 LAB — POCT GLYCOSYLATED HEMOGLOBIN (HGB A1C): Hemoglobin A1C: 5.2

## 2013-03-22 MED ORDER — NYSTATIN 100000 UNIT/ML MT SUSP: 500000.0000 [IU] | Freq: Four times a day (QID) | OROMUCOSAL | Status: DC

## 2013-03-22 MED ORDER — DIPHENHYD-HYDROCORT-NYSTATIN MT SUSP: 5.0000 mL | Freq: Four times a day (QID) | OROMUCOSAL | Status: DC | PRN

## 2013-03-22 NOTE — Progress Notes (Signed)
Patient ID: Kimberly [name] MRN: [id number], DOB: Apr 04, 1986, 27 y.o. Date of Encounter: 03/22/2013, 7:45 PM  Primary Physician: No PCP Per Patient  Chief Complaint: Coating on tongue x 1 day  HPI: 27 y.o. female with history below presents with a coating along the surface of her tongue for one day. Patient initially seen on 03/18/13 for pharyngitis, right sided lymphadenopathy, and right sided otalgia. At that time she was started on [facility] MM, Zyrtec, and ibuprofen. She did improve until today when developed a coating along the surface of her tongue. Not painful. Everytime she swallows her saliva she feels something on the surface of her tongue that she should not, she states. Afebrile. Sore throat and otalgia have improved. No inhalers or inhaled steroids. No polydipsia, polyphagia, or weight changes. Gets up to void one time per night, that is baseline for her. No family history of DM. Never used tobacco. Socially drinks alcohol. Currently sexually active with one long term female partner. Uses condoms most times. Last HIV test one year prior and was negative.      No past medical history on file.   Home Meds: Prior to Admission medications   Medication Sig Start Date End Date Taking? Authorizing Provider  Alum & Mag Hydroxide-Simeth (MAGIC MOUTHWASH W/LIDOCAINE) SOLN 1-2 tsp swish gargle spit q1-2h prn sore throat 03/18/13  Yes Morrell Riddle, PA-C  cetirizine (ZYRTEC) 10 MG tablet Take 1 tablet (10 mg total) by mouth daily. 03/18/13  Yes Morrell Riddle, PA-C  ibuprofen (ADVIL,MOTRIN) 800 MG tablet Take 1 tablet (800 mg total) by mouth every 8 (eight) hours as needed for pain. 03/18/13  Yes Morrell Riddle, PA-C    Allergies: No Known Allergies  History   Social History  . Marital Status: Single    Spouse Name: N/A    Number of Children: N/A  . Years of Education: N/A   Occupational History  . Not on file.   Social History Main Topics  . Smoking status: Never Smoker   . Smokeless tobacco: Not  on file  . Alcohol Use: No  . Drug Use: No  . Sexually Active: Yes   Other Topics Concern  . Not on file   Social History Narrative  . No narrative on file     Review of Systems: Constitutional: negative for chills, fever, night sweats, weight changes, or fatigue  HEENT: negative for vision changes, hearing loss, congestion, rhinorrhea, ST, epistaxis, or sinus pressure Cardiovascular: negative for chest pain or palpitations Respiratory: negative for hemoptysis, wheezing, shortness of breath, or cough Dermatological: negative for rash Neurologic: negative for headache, dizziness, or syncope   Physical Exam: Blood pressure 137/85, pulse 93, temperature 99.3 F (37.4 C), temperature source Oral, resp. rate 16, height 5' 3.5" (1.613 m), weight 254 lb 6.4 oz (115.395 kg), last menstrual period 02/25/2013, SpO2 99.00%., Body mass index is 44.35 kg/(m^2). General: Well developed, well nourished, in no acute distress. Head: Normocephalic, atraumatic, eyes without discharge, sclera non-icteric, nares are without discharge. Bilateral auditory canals clear, TM's are without perforation, pearly grey and translucent with reflective cone of light bilaterally. Oral cavity moist, posterior pharynx without exudate, erythema, peritonsillar abscess, or post nasal drip. Very early white plaques along lateral edges of the tongue with an erythematous base.  Neck: Supple. No thyromegaly. Full ROM. No lymphadenopathy. Lungs: Clear bilaterally to auscultation without wheezes, rales, or rhonchi. Breathing is unlabored. Heart: RRR with S1 S2. No murmurs, rubs, or gallops appreciated. Msk:  Strength and  tone normal for age. Extremities/Skin: Warm and dry. No clubbing or cyanosis. No edema. No rashes or suspicious lesions. Neuro: Alert and oriented X 3. Moves all extremities spontaneously. Gait is normal. CNII-XII grossly in tact. Psych:  Responds to questions appropriately with a normal affect.    Labs: Results for orders placed in visit on 03/22/13  POCT CBC      Result Value Range   WBC 9.4  4.6 - 10.2 K/uL   Lymph, poc 1.8  0.6 - 3.4   POC LYMPH PERCENT 19.6  10 - 50 %L   MID (cbc) 0.5  0 - 0.9   POC MID % 5.6  0 - 12 %M   POC Granulocyte 7.0 (*) 2 - 6.9   Granulocyte percent 74.8  37 - 80 %G   RBC 4.36  4.04 - 5.48 M/uL   Hemoglobin 9.5 (*) 12.2 - 16.2 g/dL   HCT, POC 16.1 (*) 09.6 - 47.9 %   MCV 74.9 (*) 80 - 97 fL   MCH, POC 21.8 (*) 27 - 31.2 pg   MCHC 29.1 (*) 31.8 - 35.4 g/dL   RDW, POC 04.5     Platelet Count, POC 391  142 - 424 K/uL   MPV 8.4  0 - 99.8 fL  POCT SKIN KOH      Result Value Range   Skin KOH, POC Negative    GLUCOSE, POCT (MANUAL RESULT ENTRY)      Result Value Range   POC Glucose 89  70 - 99 mg/dl  POCT GLYCOSYLATED HEMOGLOBIN (HGB A1C)      Result Value Range   Hemoglobin A1C 5.2      HIV pending  ASSESSMENT AND PLAN:  27 y.o. female with thrush -Nystatin 100,000 units/mL 1 tsp po qid. Continue for 48 hours after symptoms resolve. #120 mL RF 1 -Await above lab -No definitive etiology at this time, this was discussed in detail with the patient -RTC prn   Signed, Eula Listen, PA-C 03/22/2013 7:45 PM

## 2013-03-23 LAB — HIV ANTIBODY (ROUTINE TESTING W REFLEX): HIV: NONREACTIVE

## 2014-02-11 ENCOUNTER — Encounter (HOSPITAL_COMMUNITY): Payer: Self-pay | Admitting: Emergency Medicine

## 2014-02-11 ENCOUNTER — Emergency Department (HOSPITAL_COMMUNITY)
Admission: EM | Admit: 2014-02-11 | Discharge: 2014-02-11 | Disposition: A | Discharge status: Home / Self Care | Payer: BC Managed Care – PPO | Attending: Emergency Medicine | Admitting: Emergency Medicine

## 2014-02-11 DIAGNOSIS — M542 Cervicalgia: Secondary | ICD-10-CM

## 2014-02-11 DIAGNOSIS — G43909 Migraine, unspecified, not intractable, without status migrainosus: Secondary | ICD-10-CM | POA: Insufficient documentation

## 2014-02-11 DIAGNOSIS — Z862 Personal history of diseases of the blood and blood-forming organs and certain disorders involving the immune mechanism: Secondary | ICD-10-CM | POA: Insufficient documentation

## 2014-02-11 NOTE — ED Notes (Signed)
Pt reports right sided neck pain that began two weeks ago, however patient reports that it has gotten worse. Pt reports that she was seen by urgent care prior to arrival and was instructed to follow up with her PCP for imaging. Pt reports that she was unhappy with her visit and came to ED. Pt denies injury or trauma. Pt is A/O x4, in NAD, and vitals are WDL.

## 2014-02-11 NOTE — ED Provider Notes (Signed)
CSN: [id number]     Arrival date & time 02/11/14  1625 History  This chart was scribed for non-physician practitioner, Kimberly Murders, PA-C,working with Kimberly Frames, MD, by Kimberly [name], ED Scribe.  This patient was seen in room WTR8/WTR8 and the patient's care was started at 7:05 PM.  Chief Complaint  Patient presents with  . Neck Pain   The history is provided by the patient. No language interpreter was used.   HPI Comments:  Kimberly [name] is a 28 y.o. obese female with h/o migraines and anemia, who presents to the Emergency Department complaining of moderate right-sided neck pain that started approximately two weeks ago. She states she presented to an urgent care and was told to follow up with her PCP for a CT scan of the head and neck. Patient has a PCP but cannot be seen for another two weeks. She denies any worsening or change in her condition, however, it is not improving. She also states she has a migraine currently which is similar her migraines in the past. She denies taking anything for the pain today. Pt denies fever, pain or trouble swallowing, sore throat, urinary frequency, diaphoresis, chills, weight change, CP, visual changes, photophobia or cough. She denies any injury or recent trauma to the neck.    History reviewed. No pertinent past medical history. History reviewed. No pertinent past surgical history. No family history on file. History  Substance Use Topics  . Smoking status: Never Smoker   . Smokeless tobacco: Never Used  . Alcohol Use: Yes     Comment: Socailly   OB History   Grav Para Term Preterm Abortions TAB SAB Ect Mult Living                 Review of Systems  Constitutional: Negative for fever, chills, diaphoresis, appetite change and unexpected weight change.  HENT: Negative for ear pain and sore throat.   Eyes: Negative for photophobia and visual disturbance.  Respiratory: Negative for cough.   Cardiovascular: Negative for chest pain.   Gastrointestinal: Negative for nausea, vomiting and abdominal pain.  Endocrine: Negative for cold intolerance and heat intolerance.  Genitourinary: Negative for dysuria.  Musculoskeletal: Positive for neck pain. Negative for back pain and neck stiffness.  Neurological: Positive for headaches. Negative for dizziness and weakness.  All other systems reviewed and are negative.   Allergies  Review of patient's allergies indicates no known allergies.  Home Medications   Current Outpatient Rx  Name  Route  Sig  Dispense  Refill  . Aspirin-Acetaminophen-Caffeine (GOODY HEADACHE PO)   Oral   Take 1 packet by mouth as needed (migrains).          Triage Vitals: BP 154/88  Pulse 93  Temp(Src) 98.6 F (37 C) (Oral)  Resp 18  SpO2 99%  LMP 01/25/2014  Filed Vitals:   02/11/14 1705 02/11/14 1930  BP: 154/88 117/78  Pulse: 93 84  Temp: 98.6 F (37 C)   TempSrc: Oral   Resp: 18 17  SpO2: 99% 100%    Physical Exam  Nursing note and vitals reviewed. Constitutional: She appears well-developed and well-nourished. No distress.  HENT:  Head: Normocephalic and atraumatic.    Right Ear: External ear normal.  Left Ear: External ear normal.  Nose: Nose normal.  Mouth/Throat: Oropharynx is clear and moist.  Tympanic membranes gray and translucent bilaterally with no erythema, edema, or hemotympanum. No mastoid or tragal tenderness bilaterally. No erythema to the posterior pharynx. Tonsils without  edema or exudates. Uvula midline. No trismus. No difficulty controlling secretions.  Eyes: Conjunctivae are normal.  Neck: Normal range of motion. Neck supple. No JVD present. No tracheal deviation present. Thyromegaly present.  Enlarged thyroid equal bilaterally, which is tender to palpation on the right only. No palpable nodules or masses. No overlying edema, fluctuance, erythema, wounds, or ecchymosis. No LAD. No rigidity. No pain with ROM of the neck.    Cardiovascular: Normal rate,  regular rhythm and normal heart sounds.  Exam reveals no gallop and no friction rub.   No murmur heard. Pulmonary/Chest: Effort normal and breath sounds normal. No respiratory distress. She has no wheezes. She has no rales. She exhibits no tenderness.  Abdominal: Soft. She exhibits no distension. There is no tenderness.  Musculoskeletal: Normal range of motion. She exhibits no edema and no tenderness.  Lymphadenopathy:    She has no cervical adenopathy.  Neurological: She is alert.  Skin: Skin is warm and dry. She is not diaphoretic.    ED Course  Procedures (including critical care time) DIAGNOSTIC STUDIES: Oxygen Saturation is 99% on RA, normal by my interpretation.   COORDINATION OF CARE: 7:12 PM- Advised pt to follow up with PCP to have thyroid function test. Advised pt to take Tylenol or Motrin for pain. Pt verbalizes understanding and agrees to plan.  Medications - No data to display  Labs Review Labs Reviewed - No data to display Imaging Review No results found.  EKG Interpretation  None  MDM   Kimberly [name] is a 28 y.o. female  with h/o migraines and anemia, who presents to the Emergency Department complaining of moderate right-sided neck pain that started approximately two weeks ago. Etiology of neck pain possibly due to thyromegaly. No concerning symptoms of cold/heat intolerance, diaphoresis, weight gain/loss, palpitations, or other symptoms to suggest thyroid crisis. Patient afebrile and non-toxic in appearance. No obvious signs of infection/abscess. Patient has no other complaints to suggest an infectious source including sore throat or ear pain. Vital signs stable. No difficulty controlling secretions/breathing. Patient instructed to follow-up with PCP for thyroid function testing. Return precautions, discharge instructions, and follow-up was discussed with the patient before discharge.     Discharge Medication List as of 02/11/2014  7:23 PM       Final  impressions: 1. Neck pain on right side      Kimberly Moore PA-C   This patient was discussed with Dr. Tomi [name]    I personally performed the services described in this documentation, which was scribed in my presence. The recorded information has been reviewed and is accurate.    Lucila Maine, PA-C 02/12/14 1214

## 2014-02-11 NOTE — Discharge Instructions (Signed)
Follow-up with your doctor for thyroid function testing - you may need an ultrasound  Take Ibuprofen for pain as needed  Return to the emergency department if you develop any changing/worsening condition, stiff neck, fever, difficulty swallowing or breathing, or any other concerns (please read additional information regarding your condition below)     Goiter Goiter is an enlarged thyroid gland. The thyroid gland sits at the base of the front of the neck. The gland produces hormones that regulate mood, body temperature, pulse rate, and digestion. Most goiters are painless and are not a cause for serious concern. Goiters and conditions that cause goiters can be treated if necessary.  CAUSES  Common causes of goiter include:  Graves disease (causes too much hormone to be produced [hyperthyroidism]).  Hashimoto's disease (causes too little hormone to be produced [hypothyroidism]).  Thyroiditis (inflammation of the thyroid sometimes caused by virus or pregnancy).  Nodular goiter (small bumps form; sometimes called toxic nodular goiter).  Pregnancy.  Thyroid cancer (very few goiters with nodules are cancerous).  Certain medications.  Radiation exposure.  Iodine deficiency (more common in developing countries in inland populations). RISK FACTORS Risk factors for goiter include:  A family history of goiter.  Female gender.  Inadequate iodine in the diet.  Age older than 69 years. SYMPTOMS  Many goiters do not cause symptoms. When symptoms do occur, they may include:  Swelling in the lower part of the neck. This swelling can range from a very small bump to a large lump.  A tight feeling in the throat.  A hoarse voice. Less commonly, a goiter may result in:  Coughing.  Wheezing.  Difficulty swallowing.  Difficulty breathing.  Bulging neck veins.  Dizziness. When a goiter is the result of hyperthyroidism, symptoms may include:  Rapid or irregular heart  beat.  Sicknessin your stomach (nausea).  Vomiting.  Diarrhea.  Shaking.  Irritable feeling.  Bulging eyes.  Weight loss.  Heat sensitivity.  Anxiety. When a goiter is the result of hypothyroidism, symptoms may include:  Tiredness.  Dry skin.  Constipation.  Weight gain.  Irregular menstrual cycle.  Depressed mood.  Sensitivity to cold. DIAGNOSIS  Tests used to diagnose goiter include:  A physical exam.  Blood tests, including thyroid hormone levels and antibody testing.  Ultrasonography, computerized X-ray scan (computed tomography, CT) or computerized magnetic scan (magnetic resonance imaging, MRI).  Thyroid scan (imaging along with safe radioactive injection).  Tissue sample taken (biopsy) of nodules. This is sometimes done to confirm that the nodules are not cancerous. TREATMENT  Treatment will depend on the cause of the goiter. Treatment may include:  Monitoring. In some cases, no treatment is necessary, and your doctor will monitor yourcondition at regular check ups.  Medications and supplements. Thyroid medication (thyroid hormone replacement) is available for hyperthroidism and hypothyroidism.  If inflammation is the cause, over-the-counter medication or steroid medication may be recommended.  Goiters caused by iodine deficiency can be treated with iodine supplements or changes in diet.  Radioactive iodine treatment. Radioactive iodine is injected into the blood. It travels to the thyroid gland, kills thyroid cells, and reduces the size of the gland. This is only used when the thyroid gland is overactive. Lifelong thyroid hormone medication is often necessary after this treatment.  Surgery. A procedure to remove all or part of the gland may be recommended in severe cases or when cancer is the cause. Hormones can be taken to replace the hormones normally produced by the thyroid. HOME CARE INSTRUCTIONS  Take medications as directed.  Follow your  caregiver's recommendations for any dietary changes.  Follow up with your caregiver for further examination and testing, as directed. PREVENTION   If you have a family history of goiter, discuss screening with your doctor.  Make sure you are getting enough iodine in your diet.  Use of iodized table salt can help prevent iodine deficiency. Document Released: 05/22/2010 Document Revised: 02/24/2012 Document Reviewed: 05/22/2010 [facility] Patient Information 2014 [place].   Emergency Department Resource Guide 1) Find a Doctor and Pay Out of Pocket Although you won't have to find out who is covered by your insurance plan, it is a good idea to ask around and get recommendations. You will then need to call the office and see if the doctor you have chosen will accept you as a new patient and what types of options they offer for patients who are self-pay. Some doctors offer discounts or will set up payment plans for their patients who do not have insurance, but you will need to ask so you aren't surprised when you get to your appointment.  2) Contact Your Local Health Department Not all health departments have doctors that can see patients for sick visits, but many do, so it is worth a call to see if yours does. If you don't know where your local health department is, you can check in your phone book. The CDC also has a tool to help you locate your state's health department, and many state websites also have listings of all of their local health departments.  3) Find a [facility] If your illness is not likely to be very severe or complicated, you may want to try a walk in clinic. These are popping up all over the country in pharmacies, drugstores, and shopping centers. They're usually staffed by nurse practitioners or physician assistants that have been trained to treat common illnesses and complaints. They're usually fairly quick and inexpensive. However, if you have serious medical issues  or chronic medical problems, these are probably not your best option.  No Primary Care Doctor: - Call Health Connect at  [phone] - they can help you locate a primary care doctor that  accepts your insurance, provides certain services, etc. - Physician Referral Service- [phone]  Chronic Pain Problems: Organization         Address  Phone   Notes  [facility]  [phone] Patients need to be referred by their primary care doctor.   Medication Assistance: Organization         Address  Phone   Notes  [facility] Medication [facility] [address] [place], [place] [address] [phone] --Must be a resident of [facility] -- Must have NO insurance coverage whatsoever (no Medicaid/ Medicare, etc.) -- The pt. MUST have a primary care doctor that directs their care regularly and follows them in the community   MedAssist  [phone]   [employer]  [phone]    Agencies that provide inexpensive medical care: Organization         Address  Phone   Notes  [place]  [phone]   [facility]    [phone]   [facility] [place], [place] [address] [phone]   [place] [address], [place] [phone]   Planned Parenthood    [phone]   [facility]    [phone]   [place] [address], [place] Phone:  [phone], Fax:  [phone] Hours of Operation:  9 am - 6 pm, M-F.  Also accepts Medicaid/Medicare and self-pay.  [facility] for [place], [address], [place] Phone: [phone], Fax: [phone]. Hours of Operation:  8:30 am - 5:30 pm, M-F.  Also accepts Medicaid and self-pay.  [facility] [address] [place] Phone: [phone]   [place], [place], [place]  [phone], Ext. [phone] Mondays & Thursdays: 7-9 AM.  First 15 patients are seen on a first come, first serve basis.    [place] Providers:  Organization         Address  Phone   Notes  [facility] [address], [place] [phone] Also accepts self-pay patients.  [facility] [address], [place]  [phone]   [place], [address], [place] [phone]   [facility] [address] [place] [phone]   Lucianne Lei [address], [place]   [phone] Only accepts [place] Access [place] patients after they have their name applied to their card.   Self-Pay (no insurance) in [facility]:  Organization         Address  Phone   Notes  Sickle Cell Patients, [facility] [place] [phone]   [facility] [place] [phone]   [facility] [place]  [place], [place], [place] [phone]   [facility]/Dr. Osei-Bonsu  [address], [place] or [address], [place] [phone] Phone number for both [place] and [place] locations is the same.  [facility] [address] [place] [phone]   [facility] [address] [place] or [address] [phone]   [facility] [address] [place] [phone], phone; [phone], fax Sees patients 1st and 3rd Saturday of every month.  Must not qualify for public or private insurance (i.e. Medicaid, Medicare, [place] Health Choice, Veterans' Benefits)  Household income should be no more than 200% of the poverty level The clinic cannot treat you if you are pregnant or think you are pregnant  Sexually transmitted  diseases are not treated at the clinic.    Dental Care: Organization         Address  Phone  Notes  [facility] Department of [facility] [phone] Accepts children up to age 10 who are enrolled in [place] or [place]; pregnant women with a Medicaid card; and children who have applied for Medicaid or [place] Health Choice, but were declined, whose parents can pay a reduced fee at time of service.  [facility] Department of [facility]  [address], [place] [phone] Accepts children up to age 22 who are enrolled in [place] or [place]; pregnant women with a Medicaid card; and children who have applied for Medicaid or [place] Health Choice, but were declined, whose parents can pay a reduced fee at time of service.  [place]  Access PROGRAM  [address] [place] [phone] Patients are seen by appointment only. Walk-ins are not accepted. [place] will see patients 57 years of age and older. Monday - Tuesday (8am-5pm) Most Wednesdays (8:30-5pm) $30 per visit, cash only  [facility] Adult Dental Access PROGRAM  [address], [place] [facility] [phone] Patients are seen by appointment only. Walk-ins are not accepted. [place] will see patients 61 years of age and older. One Wednesday Evening (Monthly: Volunteer Based).  $30 per visit, cash only  [place]  [phone] for adults; Children under age 78, call Graduate Pediatric Dentistry at [phone]. Children aged 60-14, please call [phone] to request a pediatric application.  Dental services are provided in all areas of dental care including fillings, crowns and bridges, complete and partial dentures, implants, gum treatment, root canals, and extractions. Preventive care is also provided. Treatment is provided to both adults and children. Patients are selected via a  lottery and there is often a waiting list.   [facility] [address] [place]  [phone] [email or web address]   [facility] [address] [place], [place] [phone], Ext. [phone] Second and Fourth Thursday of each month, opens at 6:30 AM; Clinic ends at 9 AM.  Patients are seen on a first-come first-served basis, and a limited number are seen during each clinic.   [facility]  [address] Hillard Danker [place], [place] [phone]   Eligibility Requirements You must have lived in [place], [place], or [place] counties for at least the last three months.   You cannot be eligible for state or federal sponsored [employer], including [employer], [place], or [employer].   You generally cannot be eligible for healthcare insurance through your employer.    How to apply: Eligibility screenings are held every Tuesday and Wednesday afternoon from 1:00 pm until 4:00 pm. You do not need an appointment for the interview!  [facility] [address], [place], [place]   [place]  [place] Department  [place]  [phone]    Behavioral Health Resources in the Community: Intensive Outpatient Programs Organization         Address  Phone  Notes  [place]. [address], [place], [place] [phone]   [facility] Outpatient [address], [place], [place]   ADS: Alcohol & Drug Svcs [address], [place], [place]   [place] [address]  [place], [place] or [phone]   Substance Abuse Resources Organization         Address  Phone  Notes  Alcohol and Drug Services  [phone]   [place]  [phone]   [facility] [place]   Chinita Pester  [phone]   Residential &  Outpatient Substance Abuse Program  [phone]   Psychological Services Organization         Address  Phone  Notes  [facility] [place]  [place]  [phone]   [place] [address] [place] or [phone]    Mobile Crisis Teams Organization         Address  Phone  Notes  [facility], Mobile Crisis Care Unit  [phone]   [facility]  [address] [place],  [place] [address] [place] [phone]    Self-Help/Support Groups Organization         Address  Phone             Notes  Mental Health Assoc. of [place] - variety of support groups  [place] Call for more information  Narcotics Anonymous (NA), [facility]aring [facility] [address], [employer] [place]  2 meetings at this location   Special educational needs teacher         Address  Phone  Notes  ASAP Residential Treatment [place],    [place]  [phone]   [facility]  [address] [place] [phone], [place], [place]   [place], [place] [phone] Admissions: 8am-3pm M-F  Incentives Substance [place] [address],    [place], [place] [phone]   [facility] [address] [place], [place], [place]   [facility] [address],  [place], [place]   [facility]ight Programs - Intensive Outpatient [address] Kristeen [address], [place], [place]   [facility] ([place].,  [place], [place] [phone] or [phone]   [facility] Treatment Services (RTS) [address], [place], [place] Accepts Medicaid  [facility] [place] [address],  [place] [phone] Substance Abuse/Addiction Treatment   [facility] Organization          Address  Phone  Notes  [facility]  [phone]   Domenic Schwab, PhD [address] Arlis Porta [place], [place]   [phone] or [phone]   [place], [place] [phone]   [facility] [address], [place], [place] [phone] Insurance/Medicaid/sponsorship through [facility] and Families [address]                                    [place], [place] [phone] [place] [address] [place], [place] [phone]    Dr. Adele Schilder  [phone]   [facility] Dept. 1) [address], [place] 2) [place] 3)  [place] [address], [place] [phone]  [phone]   [place] [phone] or [phone] (After Hours)

## 2014-02-11 NOTE — ED Notes (Signed)
Pt ambulatory to exam room with steady gait. Pt states she has pain to R side of her neck for the past 2 weeks. States area is swollen and tender to touch.

## 2014-02-12 NOTE — ED Provider Notes (Signed)
Medical screening examination/treatment/procedure(s) were performed by non-physician practitioner and as supervising physician I was immediately available for consultation/collaboration.    Clotine Heiner R Rilea Arutyunyan, MD 02/12/14 2300 

## 2015-08-01 ENCOUNTER — Other Ambulatory Visit: Payer: Self-pay | Admitting: Obstetrics and Gynecology

## 2015-08-02 LAB — CYTOLOGY - PAP

## 2016-12-16 DIAGNOSIS — R011 Cardiac murmur, unspecified: Secondary | ICD-10-CM

## 2016-12-16 HISTORY — DX: Cardiac murmur, unspecified: R01.1

## 2020-11-01 NOTE — Progress Notes (Signed)
MIKIAH DURALL  11/01/2020      Your procedure is scheduled on 11/14/2020   Report to [facility]  at     [place].M.  Call this number if you have problems the morning of surgery:[phone]  OUR ADDRESS IS [place], WE ARE LOCATED IN THE [facility] WITH [facility].   Remember:  Do not eat food  liquids after midnight.  Take these medicines the morning of surgery with A SIP OF WATERnone    Do not wear jewelry, make-up or nail polish.  Do not wear lotions, powders, or perfumes, or deoderant.  Do not shave 48 hours prior to surgery.  Men may shave face and neck.  Do not bring valuables to the hospital.  [facility] is not responsible for any belongings or valuables.  Contacts, dentures or bridgework may not be worn into surgery.  Leave your suitcase in the car.  After surgery it may be brought to your room.  For patients admitted to the hospital, discharge time will be determined by your treatment team.  Patients discharged the day of surgery will not be allowed to drive home.     Please read over the following fact sheets that you were given: NO SOLID FOOD AFTER MIDNIGHT THE NIGHT PRIOR TO SURGERY. NOTHING BY MOUTH EXCEPT CLEAR LIQUIDS [id number]  . PLEASE FINISH ENSURE DRINK PER SURGEON ORDER  WHICH NEEDS TO BE COMPLETED AT . [place] - Preparing for Surgery Before surgery, you can play an important role.  Because skin is not sterile, your skin needs to be as free of germs as possible.  You can reduce the number of germs on your skin by washing with CHG (chlorahexidine gluconate) soap before surgery.  CHG is an antiseptic cleaner which kills germs and bonds with the skin to continue killing germs even after washing. Please DO NOT use if you have an allergy to CHG or antibacterial soaps.  If your skin becomes reddened/irritated stop using the CHG and inform your nurse when you arrive at Short Stay. Do not shave (including legs and  underarms) for at least 48 hours prior to the first CHG shower.  You may shave your face/neck. Please follow these instructions carefully:  1.  Shower with CHG Soap the night before surgery and the  morning of Surgery.  2.  If you choose to wash your hair, wash your hair first as usual with your  normal  shampoo.  3.  After you shampoo, rinse your hair and body thoroughly to remove the  shampoo.                           4.  Use CHG as you would any other liquid soap.  You can apply chg directly  to the skin and wash                       Gently with a scrungie or clean washcloth.  5.  Apply the CHG Soap to your body ONLY FROM THE NECK DOWN.   Do not use on face/ open                           Wound or open sores. Avoid contact with eyes, ears mouth and genitals (private parts).  Wash face,  Genitals (private parts) with your normal soap.             6.  Wash thoroughly, paying special attention to the area where your surgery  will be performed.  7.  Thoroughly rinse your body with warm water from the neck down.  8.  DO NOT shower/wash with your normal soap after using and rinsing off  the CHG Soap.                9.  Pat yourself dry with a clean towel.            10.  Wear clean pajamas.            11.  Place clean sheets on your bed the night of your first shower and do not  sleep with pets. Day of Surgery : Do not apply any lotions/deodorants the morning of surgery.  Please wear clean clothes to the hospital/surgery center.  FAILURE TO FOLLOW THESE INSTRUCTIONS MAY RESULT IN THE CANCELLATION OF YOUR SURGERY PATIENT SIGNATURE_________________________________  NURSE SIGNATURE__________________________________  ________________________________________________________________________

## 2020-11-03 ENCOUNTER — Other Ambulatory Visit: Payer: Self-pay

## 2020-11-03 ENCOUNTER — Encounter (HOSPITAL_COMMUNITY)
Admission: RE | Admit: 2020-11-03 | Discharge: 2020-11-03 | Disposition: A | Discharge status: Home / Self Care | Payer: BC Managed Care – PPO | Source: Ambulatory Visit | Attending: Obstetrics & Gynecology | Admitting: Obstetrics & Gynecology

## 2020-11-03 ENCOUNTER — Encounter (HOSPITAL_COMMUNITY): Payer: Self-pay

## 2020-11-03 DIAGNOSIS — Z01812 Encounter for preprocedural laboratory examination: Secondary | ICD-10-CM | POA: Diagnosis not present

## 2020-11-03 HISTORY — DX: Anemia, unspecified: D64.9

## 2020-11-03 HISTORY — DX: Cardiac murmur, unspecified: R01.1

## 2020-11-03 LAB — CBC
HCT: 29.4 % — ABNORMAL LOW (ref 36.0–46.0)
Hemoglobin: 8.2 g/dL — ABNORMAL LOW (ref 12.0–15.0)
MCH: 20.7 pg — ABNORMAL LOW (ref 26.0–34.0)
MCHC: 27.9 g/dL — ABNORMAL LOW (ref 30.0–36.0)
MCV: 74.1 fL — ABNORMAL LOW (ref 80.0–100.0)
Platelets: 396 10*3/uL (ref 150–400)
RBC: 3.97 MIL/uL (ref 3.87–5.11)
RDW: 20.6 % — ABNORMAL HIGH (ref 11.5–15.5)
WBC: 5.7 10*3/uL (ref 4.0–10.5)
nRBC: 0 % (ref 0.0–0.2)

## 2020-11-03 LAB — COMPREHENSIVE METABOLIC PANEL
ALT: 14 U/L (ref 0–44)
AST: 12 U/L — ABNORMAL LOW (ref 15–41)
Albumin: 3.9 g/dL (ref 3.5–5.0)
Alkaline Phosphatase: 77 U/L (ref 38–126)
Anion gap: 6 (ref 5–15)
BUN: 9 mg/dL (ref 6–20)
CO2: 26 mmol/L (ref 22–32)
Calcium: 9 mg/dL (ref 8.9–10.3)
Chloride: 106 mmol/L (ref 98–111)
Creatinine, Ser: 0.73 mg/dL (ref 0.44–1.00)
GFR, Estimated: >60 mL/min (ref >60)
Glucose, Bld: 98 mg/dL (ref 70–99)
Potassium: 4.6 mmol/L (ref 3.5–5.1)
Sodium: 138 mmol/L (ref 135–145)
Total Bilirubin: 0.5 mg/dL (ref 0.3–1.2)
Total Protein: 7.1 g/dL (ref 6.5–8.1)

## 2020-11-03 LAB — TYPE AND SCREEN
ABO/RH(D): O POS
Antibody Screen: NEGATIVE

## 2020-11-03 NOTE — Progress Notes (Signed)
Anesthesia Review:  PCP: DR Aldona Lento LOV 10/16/2020  Cardiologist : none  Chest x-ray : EKG : Echo : Stress test: Cardiac Cath :  Activity level: can do a flight of stairs without difficulty  Sleep Study/ CPAP : none  Fasting Blood Sugar :      / Checks Blood Sugar -- times a day:   Blood Thinner/ Instructions /Last Dose: ASA / Instructions/ Last Dose :  Patient nervous at time of preop appt.  Patient ? Heart murmur- not sure.  Not followed by a cardiologist.   CBC done 11/03/2020 routed to Dr Lynnette Caffey.

## 2020-11-10 ENCOUNTER — Other Ambulatory Visit (HOSPITAL_COMMUNITY)
Admission: RE | Admit: 2020-11-10 | Discharge: 2020-11-10 | Disposition: A | Discharge status: Home / Self Care | Payer: BC Managed Care – PPO | Source: Ambulatory Visit | Attending: Obstetrics & Gynecology | Admitting: Obstetrics & Gynecology

## 2020-11-10 DIAGNOSIS — Z01818 Encounter for other preprocedural examination: Secondary | ICD-10-CM | POA: Insufficient documentation

## 2020-11-10 DIAGNOSIS — Z20822 Contact with and (suspected) exposure to covid-19: Secondary | ICD-10-CM | POA: Insufficient documentation

## 2020-11-10 LAB — SARS CORONAVIRUS 2 (TAT 6-24 HRS): SARS Coronavirus 2: NEGATIVE

## 2020-11-10 NOTE — H&P (Signed)
Kimberly [name] is an 34 y.o. G27 female with regular, heavy menses to anemia.  U/S shows 619 cc uterine volume with multiple fibroids: 4.2, 3.8, 3.4, 2.8, 2.7, 2.5 cm intramural and 4.2 and 3.9 cm abutting the endometrial lining.  Patient is nulliparous and while she desires child-bearing, she is not currently in a relationship.  She was offered hormonal options vs surgical options and chose to proceed with surgery.  She was offered abdominal myomectomy to remove all fibroids vs h/o myomectomy to address submucosal fibroids only and at this time, would like to proceed with h/s myomectomy.    Pertinent Gynecological History: Menses: flow is excessive with use of 8 pads or tampons on heaviest days Bleeding: regular Contraception: condoms DES exposure: unknown Blood transfusions: none Sexually transmitted diseases: no past history Previous GYN Procedures: none  Last mammogram: n/a Date: n/a Last pap: normal Date: 05/14/19 OB History: G0   Menstrual History: Menarche age: n/a Patient's last menstrual period was 10/23/2020.    Past Medical History:  Diagnosis Date  . Anemia   . Heart murmur    ?     Past Surgical History:  Procedure Laterality Date  . NO PAST SURGERIES      No family history on file.  Social History:  reports that she has never smoked. She has never used smokeless tobacco. She reports current alcohol use. She reports that she does not use drugs.  Allergies: No Known Allergies  No medications prior to admission.    Review of Systems  Last menstrual period 10/23/2020. Physical Exam Constitutional:      Appearance: Normal appearance.  HENT:     Head: Normocephalic.  Cardiovascular:     Rate and Rhythm: Normal rate and regular rhythm.     Heart sounds: Normal heart sounds.  Pulmonary:     Effort: Pulmonary effort is normal.  Abdominal:     Palpations: Abdomen is soft.  Genitourinary:    General: Normal vulva.  Musculoskeletal:        General: Normal range  of motion.     Cervical back: Normal range of motion.  Skin:    General: Skin is warm and dry.  Neurological:     Mental Status: She is alert and oriented to person, place, and time.  Psychiatric:        Mood and Affect: Mood normal.        Behavior: Behavior normal.     No results found for this or any previous visit (from the past 24 hour(s)).  No results found.  Assessment/Plan: 34yo G0 with HMB and uterine leiomyomata -H/S, Myosure-patient is counseled re: risk of bleeding, infection, scarring, and damage to surrounding structures.  She is informed that only submucosal fibroids would be managed during this procedure and her remaining fibroids will slowly grow over time possibly requiring surgery in the future to assist with fertility and possible relief from symptoms.  All questions were answered and the patient wishes to proceed.  Linda Hedges 11/10/2020, 10:24 AM

## 2020-11-13 NOTE — Anesthesia Preprocedure Evaluation (Addendum)
Anesthesia Evaluation  Patient identified by MRN, date of birth, ID band Patient awake    Reviewed: Allergy & Precautions, NPO status , Patient's Chart, lab work & pertinent test results  Airway Mallampati: III  TM Distance: >3 FB Neck ROM: Full    Dental no notable dental hx. (+) Teeth Intact, Dental Advisory Given   Pulmonary neg pulmonary ROS,    Pulmonary exam normal breath sounds clear to auscultation       Cardiovascular Exercise Tolerance: Good Normal cardiovascular exam Rhythm:Regular Rate:Normal     Neuro/Psych negative neurological ROS  negative psych ROS   GI/Hepatic negative GI ROS, Neg liver ROS,   Endo/Other  negative endocrine ROS  Renal/GU negative Renal ROSK+4.6 Cr 0.73     Musculoskeletal negative musculoskeletal ROS (+)   Abdominal (+) + obese,   Peds  Hematology  (+) anemia , Hgb 8.2 T&S avail   Anesthesia Other Findings   Reproductive/Obstetrics negative OB ROS                           Anesthesia Physical Anesthesia Plan  ASA: III  Anesthesia Plan: General   Post-op Pain Management:    Induction: Intravenous  PONV Risk Score and Plan: 4 or greater and Treatment may vary due to age or medical condition, Ondansetron, Dexamethasone and Midazolam  Airway Management Planned: LMA  Additional Equipment: None  Intra-op Plan:   Post-operative Plan:   Informed Consent: I have reviewed the patients History and Physical, chart, labs and discussed the procedure including the risks, benefits and alternatives for the proposed anesthesia with the patient or authorized representative who has indicated his/her understanding and acceptance.     Dental advisory given  Plan Discussed with: CRNA and Anesthesiologist  Anesthesia Plan Comments: (LMA GA)       Anesthesia Quick Evaluation

## 2020-11-14 ENCOUNTER — Ambulatory Visit (HOSPITAL_BASED_OUTPATIENT_CLINIC_OR_DEPARTMENT_OTHER): Payer: BC Managed Care – PPO | Admitting: Physician Assistant

## 2020-11-14 ENCOUNTER — Encounter (HOSPITAL_BASED_OUTPATIENT_CLINIC_OR_DEPARTMENT_OTHER): Payer: Self-pay | Admitting: Obstetrics & Gynecology

## 2020-11-14 ENCOUNTER — Ambulatory Visit (HOSPITAL_BASED_OUTPATIENT_CLINIC_OR_DEPARTMENT_OTHER): Payer: BC Managed Care – PPO | Admitting: Anesthesiology

## 2020-11-14 ENCOUNTER — Encounter (HOSPITAL_BASED_OUTPATIENT_CLINIC_OR_DEPARTMENT_OTHER)
Admission: RE | Disposition: A | Discharge status: Home / Self Care | Payer: Self-pay | Source: Home / Self Care | Attending: Obstetrics & Gynecology

## 2020-11-14 ENCOUNTER — Ambulatory Visit (HOSPITAL_BASED_OUTPATIENT_CLINIC_OR_DEPARTMENT_OTHER)
Admission: RE | Admit: 2020-11-14 | Discharge: 2020-11-14 | Disposition: A | Discharge status: Home / Self Care | Payer: BC Managed Care – PPO | Attending: Obstetrics & Gynecology | Admitting: Obstetrics & Gynecology

## 2020-11-14 DIAGNOSIS — D251 Intramural leiomyoma of uterus: Secondary | ICD-10-CM | POA: Diagnosis not present

## 2020-11-14 DIAGNOSIS — D259 Leiomyoma of uterus, unspecified: Secondary | ICD-10-CM

## 2020-11-14 DIAGNOSIS — N920 Excessive and frequent menstruation with regular cycle: Secondary | ICD-10-CM | POA: Insufficient documentation

## 2020-11-14 HISTORY — PX: DILATATION & CURETTAGE/HYSTEROSCOPY WITH MYOSURE: SHX6511

## 2020-11-14 LAB — ABO/RH: ABO/RH(D): O POS

## 2020-11-14 LAB — POCT PREGNANCY, URINE: Preg Test, Ur: NEGATIVE

## 2020-11-14 SURGERY — DILATATION & CURETTAGE/HYSTEROSCOPY WITH MYOSURE
Anesthesia: General | Site: Vagina

## 2020-11-14 MED ORDER — DEXAMETHASONE SODIUM PHOSPHATE 10 MG/ML IJ SOLN
INTRAMUSCULAR | Status: AC
  Filled 2020-11-14: qty 1

## 2020-11-14 MED ORDER — MIDAZOLAM HCL 2 MG/2ML IJ SOLN
INTRAMUSCULAR | Status: DC | PRN
  Administered 2020-11-14: 2 mg via INTRAVENOUS

## 2020-11-14 MED ORDER — HYDROMORPHONE HCL 1 MG/ML IJ SOLN: 0.2500 mg | INTRAMUSCULAR | Status: DC | PRN

## 2020-11-14 MED ORDER — ARTIFICIAL TEARS OPHTHALMIC OINT
TOPICAL_OINTMENT | OPHTHALMIC | Status: AC
  Filled 2020-11-14: qty 3.5

## 2020-11-14 MED ORDER — SCOPOLAMINE 1 MG/3DAYS TD PT72
1.0000 | MEDICATED_PATCH | TRANSDERMAL | Status: DC
  Administered 2020-11-14: 1.5 mg via TRANSDERMAL

## 2020-11-14 MED ORDER — PROPOFOL 10 MG/ML IV BOLUS
INTRAVENOUS | Status: AC
  Filled 2020-11-14: qty 40

## 2020-11-14 MED ORDER — LIDOCAINE 2% (20 MG/ML) 5 ML SYRINGE
INTRAMUSCULAR | Status: DC | PRN
  Administered 2020-11-14: 100 mg via INTRAVENOUS

## 2020-11-14 MED ORDER — FENTANYL CITRATE (PF) 100 MCG/2ML IJ SOLN
INTRAMUSCULAR | Status: AC
  Filled 2020-11-14: qty 2

## 2020-11-14 MED ORDER — OXYCODONE HCL 5 MG PO TABS: 5.0000 mg | ORAL_TABLET | Freq: Once | ORAL | Status: DC | PRN

## 2020-11-14 MED ORDER — ONDANSETRON HCL 4 MG/2ML IJ SOLN
INTRAMUSCULAR | Status: AC
  Filled 2020-11-14: qty 2

## 2020-11-14 MED ORDER — OXYCODONE HCL 5 MG/5ML PO SOLN: 5.0000 mg | Freq: Once | ORAL | Status: DC | PRN

## 2020-11-14 MED ORDER — KETOROLAC TROMETHAMINE 30 MG/ML IJ SOLN
INTRAMUSCULAR | Status: AC
  Filled 2020-11-14: qty 1

## 2020-11-14 MED ORDER — LACTATED RINGERS IV SOLN: INTRAVENOUS | Status: DC

## 2020-11-14 MED ORDER — OXYCODONE-ACETAMINOPHEN 5-325 MG PO TABS: 1.0000 | ORAL_TABLET | ORAL | 0 refills | Status: DC | PRN

## 2020-11-14 MED ORDER — AMISULPRIDE (ANTIEMETIC) 5 MG/2ML IV SOLN: 10.0000 mg | Freq: Once | INTRAVENOUS | Status: DC | PRN

## 2020-11-14 MED ORDER — IBUPROFEN 800 MG PO TABS: 800.0000 mg | ORAL_TABLET | Freq: Four times a day (QID) | ORAL | 0 refills | Status: DC | PRN

## 2020-11-14 MED ORDER — PROPOFOL 10 MG/ML IV BOLUS
INTRAVENOUS | Status: DC | PRN
  Administered 2020-11-14: 200 mg via INTRAVENOUS
  Administered 2020-11-14: 50 mg via INTRAVENOUS

## 2020-11-14 MED ORDER — POVIDONE-IODINE 10 % EX SWAB: 2.0000 "application " | Freq: Once | CUTANEOUS | Status: DC

## 2020-11-14 MED ORDER — FENTANYL CITRATE (PF) 100 MCG/2ML IJ SOLN
INTRAMUSCULAR | Status: DC | PRN
  Administered 2020-11-14 (×2): 50 ug via INTRAVENOUS

## 2020-11-14 MED ORDER — ONDANSETRON HCL 4 MG/2ML IJ SOLN
INTRAMUSCULAR | Status: DC | PRN
  Administered 2020-11-14: 4 mg via INTRAVENOUS

## 2020-11-14 MED ORDER — MIDAZOLAM HCL 2 MG/2ML IJ SOLN
INTRAMUSCULAR | Status: AC
  Filled 2020-11-14: qty 2

## 2020-11-14 MED ORDER — ONDANSETRON HCL 4 MG/2ML IJ SOLN: 4.0000 mg | Freq: Once | INTRAMUSCULAR | Status: DC | PRN

## 2020-11-14 MED ORDER — LIDOCAINE HCL (PF) 2 % IJ SOLN
INTRAMUSCULAR | Status: AC
  Filled 2020-11-14: qty 5

## 2020-11-14 MED ORDER — SODIUM CHLORIDE 0.9 % IR SOLN
Status: DC | PRN
  Administered 2020-11-14: 1059 mL

## 2020-11-14 MED ORDER — DEXAMETHASONE SODIUM PHOSPHATE 10 MG/ML IJ SOLN
INTRAMUSCULAR | Status: DC | PRN
  Administered 2020-11-14: 10 mg via INTRAVENOUS

## 2020-11-14 MED ORDER — SCOPOLAMINE 1 MG/3DAYS TD PT72
MEDICATED_PATCH | TRANSDERMAL | Status: AC
  Filled 2020-11-14: qty 1

## 2020-11-14 MED ORDER — KETOROLAC TROMETHAMINE 30 MG/ML IJ SOLN
30.0000 mg | Freq: Once | INTRAMUSCULAR | Status: AC | PRN
  Administered 2020-11-14: 30 mg via INTRAVENOUS

## 2020-11-14 SURGICAL SUPPLY — 27 items
BIPOLAR CUTTING LOOP 21FR (ELECTRODE)
CATH ROBINSON RED A/P 16FR (CATHETERS) ×3 IMPLANT
COVER WAND RF STERILE (DRAPES) ×3 IMPLANT
DEVICE MYOSURE LITE (MISCELLANEOUS) ×2 IMPLANT
DEVICE MYOSURE REACH (MISCELLANEOUS) IMPLANT
DILATOR CANAL MILEX (MISCELLANEOUS) IMPLANT
GAUZE 4X4 16PLY RFD (DISPOSABLE) ×1 IMPLANT
GLOVE BIO SURGEON STRL SZ 6 (GLOVE) ×3 IMPLANT
GLOVE BIO SURGEON STRL SZ7 (GLOVE) ×2 IMPLANT
GLOVE BIOGEL PI IND STRL 6 (GLOVE) ×1 IMPLANT
GLOVE BIOGEL PI IND STRL 7.0 (GLOVE) IMPLANT
GLOVE BIOGEL PI INDICATOR 6 (GLOVE) ×2
GLOVE BIOGEL PI INDICATOR 7.0 (GLOVE) ×2
GLOVE SURG SS PI 7.0 STRL IVOR (GLOVE) ×2 IMPLANT
GOWN STRL REUS W/ TWL LRG LVL3 (GOWN DISPOSABLE) ×1 IMPLANT
GOWN STRL REUS W/TWL LRG LVL3 (GOWN DISPOSABLE) ×7 IMPLANT
IV NS IRRIG 3000ML ARTHROMATIC (IV SOLUTION) ×3 IMPLANT
KIT PROCEDURE FLUENT (KITS) ×3 IMPLANT
KIT TURNOVER CYSTO (KITS) ×3 IMPLANT
LOOP CUTTING BIPOLAR 21FR (ELECTRODE) IMPLANT
MYOSURE XL FIBROID (MISCELLANEOUS)
PACK VAGINAL MINOR WOMEN LF (CUSTOM PROCEDURE TRAY) ×3 IMPLANT
PAD OB MATERNITY 4.3X12.25 (PERSONAL CARE ITEMS) ×3 IMPLANT
SEAL CERVICAL OMNI LOK (ABLATOR) IMPLANT
SEAL ROD LENS SCOPE MYOSURE (ABLATOR) ×3 IMPLANT
SYSTEM TISS REMOVAL MYOSURE XL (MISCELLANEOUS) IMPLANT
WATER STERILE IRR 500ML POUR (IV SOLUTION) ×1 IMPLANT

## 2020-11-14 NOTE — Discharge Instructions (Signed)
Call MD for T>100.4, heavy vaginal bleeding, severe abdominal pain, or respiratory distress.  Call office to schedule postop appointment in 2 weeks.  Pelvic rest x 4 weeks.  No driving while taking narcotics.   Post Anesthesia Home Care Instructions  Activity: Get plenty of rest for the remainder of the day. A responsible individual must stay with you for 24 hours following the procedure.  For the next 24 hours, DO NOT: -Drive a car -Paediatric nurse -Drink alcoholic beverages -Take any medication unless instructed by your physician -Make any legal decisions or sign important papers.  Meals: Start with liquid foods such as gelatin or soup. Progress to regular foods as tolerated. Avoid greasy, spicy, heavy foods. If nausea and/or vomiting occur, drink only clear liquids until the nausea and/or vomiting subsides. Call your physician if vomiting continues.  Special Instructions/Symptoms: Your throat may feel dry or sore from the anesthesia or the breathing tube placed in your throat during surgery. If this causes discomfort, gargle with warm salt water. The discomfort should disappear within 24 hours.  If you had a scopolamine patch placed behind your ear for the management of post- operative nausea and/or vomiting:  1. The medication in the patch is effective for 72 hours, after which it should be removed.  Wrap patch in a tissue and discard in the trash. Wash hands thoroughly with soap and water. 2. You may remove the patch earlier than 72 hours if you experience unpleasant side effects which may include dry mouth, dizziness or visual disturbances. 3. Avoid touching the patch. Wash your hands with soap and water after contact with the patch.  Remove patch behind Right ear by Friday, November 17, 2020.  DISCHARGE INSTRUCTIONS: HYSTEROSCOPY The following instructions have been prepared to help you care for yourself upon your return home.  May take stool softner while taking narcotic pain  medication to prevent constipation.  Drink plenty of water.  Personal hygiene: Marland Kitchen Use sanitary pads for vaginal drainage, not tampons. . Shower the day after your procedure. . NO tub baths, pools or Jacuzzis for 2-3 weeks. . Wipe front to back after using the bathroom.  Activity and limitations: . Do NOT drive or operate any equipment for 24 hours. The effects of anesthesia are still present and drowsiness may result. . Do NOT rest in bed all day. . Walking is encouraged. . Walk up and down stairs slowly. . You may resume your normal activity in one to two days or as indicated by your physician. Sexual activity: NO intercourse for at least 2 weeks after the procedure, or as indicated by your Doctor.  Diet: Eat a light meal as desired this evening. You may resume your usual diet tomorrow.  Return to Work: You may resume your work activities in one to two days or as indicated by Marine scientist.  What to expect after your surgery: Expect to have vaginal bleeding/discharge for 2-3 days and spotting for up to 10 days. It is not unusual to have soreness for up to 1-2 weeks. You may have a slight burning sensation when you urinate for the first day. Mild cramps may continue for a couple of days. You may have a regular period in 2-6 weeks.  Call your doctor for any of the following: . Excessive vaginal bleeding or clotting, saturating and changing one pad every hour. . Inability to urinate 6 hours after discharge from hospital. . Pain not relieved by pain medication. . Fever of 100.4 F or greater. . Unusual  vaginal discharge or odor.

## 2020-11-14 NOTE — Op Note (Signed)
PREOPERATIVE DIAGNOSIS:  34 y.o. with heavy menstrual bleeding and uterine leiomyomata  POSTOPERATIVE DIAGNOSIS: The same  PROCEDURE: Hysteroscopy, Dilation and Curettage, Myosure (Lite)  SURGEON:  Dr. Linda Hedges  INDICATIONS: 34 y.o. G0 here for scheduled surgery for submucosal uterine leiomyomata.   Risks of surgery were discussed with the patient including but not limited to: bleeding which may require transfusion; infection which may require antibiotics; injury to uterus or surrounding organs; intrauterine scarring which may impair future fertility; need for additional procedures including laparotomy or laparoscopy; and other postoperative/anesthesia complications. Written informed consent was obtained.    FINDINGS:  A 12 week size uterus.  Diffuse proliferative endometrium.  Small fibroid adjacent to left ostium.  Thickened endometrial lining posterior right uterine wall.  Normal ostia bilaterally.  ANESTHESIA:   General  ESTIMATED BLOOD LOSS:  Less than 20 ml  SPECIMENS: Endometrial sample (Myosure specimen) and curettings sent to pathology  COMPLICATIONS:  None immediate.  PROCEDURE DETAILS:  The patient was taken to the operating room where general anesthesia was administered and was found to be adequate.  After an adequate timeout was performed, she was placed in the dorsal lithotomy position and examined; then prepped and draped in the sterile manner.   Her bladder was catheterized for an unmeasured amount of clear, yellow urine. A speculum was then placed in the patient's vagina and a single tooth tenaculum was applied to the anterior lip of the cervix. The uteus was sounded to 12 cm and dilated manually with metal dilators to accommodate the operative hysteroscope.  Once the cervix was dilated, the hysteroscope was inserted under direct visualization using saline as a suspension medium.  The uterine cavity was carefully examined, both ostia were recognized and the above dictated  findings were noted.   After further careful visualization of the uterine cavity, the Myosure Lite was advanced.  Removal of small fibroid adjacent to left tubal ostium and thickened on posterior right wall was performed under direct visualization using Myosure.  Hysteroscope was removed.  A sharp curettage was then performed to obtain a small amount of endometrial curettings.  The tenaculum was removed from the anterior lip of the cervix and the vaginal speculum was removed after noting good hemostasis.  The patient tolerated the procedure well and was taken to the recovery area awake, extubated and in stable condition.

## 2020-11-14 NOTE — Anesthesia Procedure Notes (Signed)
Procedure Name: LMA Insertion Date/Time: 11/14/2020 7:36 AM Performed by: Mechele Claude, CRNA Pre-anesthesia Checklist: Patient identified, Emergency Drugs available, Suction available and Patient being monitored Patient Re-evaluated:Patient Re-evaluated prior to induction Oxygen Delivery Method: Circle System Utilized Preoxygenation: Pre-oxygenation with 100% oxygen Induction Type: IV induction Ventilation: Mask ventilation without difficulty LMA: LMA with gastric port inserted LMA Size: 4.0 Number of attempts: 1 Placement Confirmation: positive ETCO2 Tube secured with: Tape Dental Injury: Teeth and Oropharynx as per pre-operative assessment

## 2020-11-14 NOTE — Progress Notes (Signed)
No change to H&P.  Kimberly Palm, DO 

## 2020-11-14 NOTE — Transfer of Care (Signed)
Immediate Anesthesia Transfer of Care Note  Patient: CABRINI RUGGIERI  Procedure(s) Performed: Procedure(s) (LRB): HYSTEROSCOPY, DIALATION AND CURRETAGE WITH  MYOSURE DEVICE (N/A)  Patient Location: PACU  Anesthesia Type: General  Level of Consciousness: awake, alert  and oriented  Airway & Oxygen Therapy: Patient Spontanous Breathing and Patient connected to face mask oxygen  Post-op Assessment: Report given to PACU RN and Post -op Vital signs reviewed and stable  Post vital signs: Reviewed and stable  Complications: No apparent anesthesia complications Last Vitals:  Vitals Value Taken Time  BP 119/73 11/14/20 0815  Temp 36.8 C 11/14/20 0815  Pulse 75 11/14/20 0817  Resp 15 11/14/20 0817  SpO2 100 % 11/14/20 0817  Vitals shown include unvalidated device data.  Last Pain:  Vitals:   11/14/20 0603  TempSrc: Oral  PainSc: 0-No pain      Patients Stated Pain Goal: 5 (14/60/47 9987)  Complications: No complications documented.

## 2020-11-14 NOTE — Anesthesia Postprocedure Evaluation (Signed)
Anesthesia Post Note  Patient: Kimberly [name]  Procedure(s) Performed: HYSTEROSCOPY, DIALATION AND CURRETAGE WITH  MYOSURE DEVICE (N/A Vagina )     Patient location during evaluation: PACU Anesthesia Type: General Level of consciousness: awake and alert Pain management: pain level controlled Vital Signs Assessment: post-procedure vital signs reviewed and stable Respiratory status: spontaneous breathing, nonlabored ventilation, respiratory function stable and patient connected to nasal cannula oxygen Cardiovascular status: blood pressure returned to baseline and stable Postop Assessment: no apparent nausea or vomiting Anesthetic complications: no   No complications documented.  Last Vitals:  Vitals:   11/14/20 0900 11/14/20 0947  BP: 124/70 128/88  Pulse: 71 72  Resp: 18 16  Temp:  36.7 C  SpO2: 98% 96%    Last Pain:  Vitals:   11/14/20 0947  TempSrc: Oral  PainSc:                  Barnet Glasgow

## 2020-11-15 LAB — SURGICAL PATHOLOGY

## 2020-11-16 ENCOUNTER — Encounter (HOSPITAL_BASED_OUTPATIENT_CLINIC_OR_DEPARTMENT_OTHER): Payer: Self-pay | Admitting: Obstetrics & Gynecology

## 2022-11-21 LAB — OB RESULTS CONSOLE ABO/RH: RH Type: POSITIVE

## 2022-11-21 LAB — OB RESULTS CONSOLE ANTIBODY SCREEN: Antibody Screen: NEGATIVE

## 2022-11-25 LAB — OB RESULTS CONSOLE GC/CHLAMYDIA
Chlamydia: NEGATIVE
Neisseria Gonorrhea: NEGATIVE

## 2022-11-25 LAB — HEPATITIS C ANTIBODY: HCV Ab: NEGATIVE

## 2022-11-25 LAB — OB RESULTS CONSOLE RUBELLA ANTIBODY, IGM: Rubella: IMMUNE

## 2022-11-25 LAB — OB RESULTS CONSOLE HEPATITIS B SURFACE ANTIGEN: Hepatitis B Surface Ag: NEGATIVE

## 2022-11-25 LAB — OB RESULTS CONSOLE RPR: RPR: NONREACTIVE

## 2022-11-25 LAB — OB RESULTS CONSOLE HIV ANTIBODY (ROUTINE TESTING): HIV: NONREACTIVE

## 2022-11-27 ENCOUNTER — Telehealth: Payer: Self-pay | Admitting: Hematology and Oncology

## 2022-11-27 NOTE — Telephone Encounter (Signed)
Spoke with patient confirming new patient appointment 1/8

## 2022-12-23 ENCOUNTER — Inpatient Hospital Stay: Payer: BC Managed Care – PPO | Attending: Hematology and Oncology | Admitting: Hematology and Oncology

## 2022-12-23 ENCOUNTER — Inpatient Hospital Stay: Payer: BC Managed Care – PPO

## 2022-12-23 ENCOUNTER — Other Ambulatory Visit: Payer: Self-pay

## 2022-12-23 VITALS — BP 136/85 | HR 101 | Temp 98.2°F | Resp 17 | Wt 237.5 lb

## 2022-12-23 DIAGNOSIS — Z3A16 16 weeks gestation of pregnancy: Secondary | ICD-10-CM

## 2022-12-23 DIAGNOSIS — D500 Iron deficiency anemia secondary to blood loss (chronic): Secondary | ICD-10-CM | POA: Insufficient documentation

## 2022-12-23 DIAGNOSIS — O99019 Anemia complicating pregnancy, unspecified trimester: Secondary | ICD-10-CM | POA: Insufficient documentation

## 2022-12-23 DIAGNOSIS — O99012 Anemia complicating pregnancy, second trimester: Secondary | ICD-10-CM | POA: Diagnosis not present

## 2022-12-23 LAB — CMP (CANCER CENTER ONLY)
ALT: 9 U/L (ref 0–44)
AST: 10 U/L — ABNORMAL LOW (ref 15–41)
Albumin: 4 g/dL (ref 3.5–5.0)
Alkaline Phosphatase: 68 U/L (ref 38–126)
Anion gap: 6 (ref 5–15)
BUN: 7 mg/dL (ref 6–20)
CO2: 23 mmol/L (ref 22–32)
Calcium: 9.2 mg/dL (ref 8.9–10.3)
Chloride: 103 mmol/L (ref 98–111)
Creatinine: 0.65 mg/dL (ref 0.44–1.00)
GFR, Estimated: >60 mL/min (ref >60)
Glucose, Bld: 83 mg/dL (ref 70–99)
Potassium: 3.9 mmol/L (ref 3.5–5.1)
Sodium: 132 mmol/L — ABNORMAL LOW (ref 135–145)
Total Bilirubin: 0.2 mg/dL — ABNORMAL LOW (ref 0.3–1.2)
Total Protein: 7.1 g/dL (ref 6.5–8.1)

## 2022-12-23 LAB — IRON AND IRON BINDING CAPACITY (CC-WL,HP ONLY)
Iron: 17 ug/dL — ABNORMAL LOW (ref 28–170)
Saturation Ratios: 4 % — ABNORMAL LOW (ref 10.4–31.8)
TIBC: 417 ug/dL (ref 250–450)
UIBC: 400 ug/dL (ref 148–442)

## 2022-12-23 LAB — CBC WITH DIFFERENTIAL (CANCER CENTER ONLY)
Abs Immature Granulocytes: 0.02 10*3/uL (ref 0.00–0.07)
Basophils Absolute: 0 10*3/uL (ref 0.0–0.1)
Basophils Relative: 0 %
Eosinophils Absolute: 0.1 10*3/uL (ref 0.0–0.5)
Eosinophils Relative: 1 %
HCT: 32.7 % — ABNORMAL LOW (ref 36.0–46.0)
Hemoglobin: 10 g/dL — ABNORMAL LOW (ref 12.0–15.0)
Immature Granulocytes: 0 %
Lymphocytes Relative: 10 %
Lymphs Abs: 0.9 10*3/uL (ref 0.7–4.0)
MCH: 22.4 pg — ABNORMAL LOW (ref 26.0–34.0)
MCHC: 30.6 g/dL (ref 30.0–36.0)
MCV: 73.3 fL — ABNORMAL LOW (ref 80.0–100.0)
Monocytes Absolute: 0.6 10*3/uL (ref 0.1–1.0)
Monocytes Relative: 7 %
Neutro Abs: 7.3 10*3/uL (ref 1.7–7.7)
Neutrophils Relative %: 82 %
Platelet Count: 284 10*3/uL (ref 150–400)
RBC: 4.46 MIL/uL (ref 3.87–5.11)
RDW: 23.5 % — ABNORMAL HIGH (ref 11.5–15.5)
WBC Count: 9 10*3/uL (ref 4.0–10.5)
nRBC: 0 % (ref 0.0–0.2)

## 2022-12-23 LAB — RETIC PANEL
Immature Retic Fract: 21.4 % — ABNORMAL HIGH (ref 2.3–15.9)
RBC.: 4.44 MIL/uL (ref 3.87–5.11)
Retic Count, Absolute: 46.2 10*3/uL (ref 19.0–186.0)
Retic Ct Pct: 1 % (ref 0.4–3.1)
Reticulocyte Hemoglobin: 24.6 pg — ABNORMAL LOW (ref >27.9)

## 2022-12-23 LAB — FERRITIN: Ferritin: 21 ng/mL (ref 11–307)

## 2022-12-23 NOTE — Progress Notes (Signed)
[place] Telephone:([phone]   Fax:([phone] [place] NOTE  Patient Care Team: [name], MD as PCP - General (Family Medicine)  Hematological/Oncological History # Anemia in Pregnancy 11/21/2022: Iron sat 8%, Hgb 8.9, MCV 68, Plt 365, Hgb Fractionation Negative for Hemoglobinopathy.  12/23/2022: establish care with Dr. Lorenso Courier   CHIEF COMPLAINTS/PURPOSE OF CONSULTATION:  "Anemia in Pregnancy "  HISTORY OF PRESENTING ILLNESS:  Kimberly Kimberly [name] 37 y.o. Kimberly [name] with medical history significant for fibroids who presents for evaluation of iron deficiency anemia in pregnancy.   On review of the previous records Kimberly Kimberly [name] had labs drawn on 11/21/2022 which showed an iron sat 8%, hemoglobin 8.9, MCV 68, and platelets of 365.  Hemoglobin fractionation cascade was ordered and negative for hemoglobinopathy.  Due to concern for these findings in pregnancy the patient was referred to hematology for further evaluation and management.  On exam today Kimberly Kimberly [name] reports that her due date is June 14, 2023.  She notes that she has had low hemoglobin and low iron levels "always".  She notes it is never been a major issue.  She attributes this to her heavy menstrual cycles and fibroids.  She notes her cycles last approximately 5 to 7 days and her heaviest days the second day.  She can go through a soaked pad every 2 hours.  She notes that she did have an IUD in place for 10 years but when it was "outside of the uterus, it was removed.  She reports that she did take iron pills for short period of time in the past but they caused severe constipation.  She notes that she has received an iron infusion before in the past but it "did not help".  She did not have any difficulty with the iron infusion.  On further discussion she reports her mother and father are healthy but she has a sister with ovarian cancer.  She is a never smoker but did drink some alcohol prior to the pregnancy.  She  currently works as a Designer, jewellery.  She reports that she is not having any short miss of breath, lightheadedness, or dizziness.  She reports that she does have some ice cravings but no major issues with fatigue.  She does eat a regular diet but does not routinely eat red meat.  She otherwise denies any fevers, chills, sweats, nausea, vomiting or diarrhea.  A full 10 point ROS was otherwise negative.  MEDICAL HISTORY:  Past Medical History:  Diagnosis Date   Anemia    Heart murmur    ?     SURGICAL HISTORY: Past Surgical History:  Procedure Laterality Date   DILATATION & CURETTAGE/HYSTEROSCOPY WITH MYOSURE N/A 11/14/2020   Procedure: HYSTEROSCOPY, DIALATION AND CURRETAGE WITH  MYOSURE DEVICE;  Surgeon: Linda Hedges, DO;  Location: [facility];  Service: Gynecology;  Laterality: N/A;   NO PAST SURGERIES      SOCIAL HISTORY: Social History   Socioeconomic History   Marital status: Single    Spouse name: Not on file   Number of children: Not on file   Years of education: Not on file   Highest education level: Not on file  Occupational History   Not on file  Tobacco Use   Smoking status: Never   Smokeless tobacco: Never  Vaping Use   Vaping Use: Never used  Substance and Sexual Activity   Alcohol use: Yes    Comment: Socailly   Drug use: No  Sexual activity: Yes  Other Topics Concern   Not on file  Social History Narrative   Not on file   Social Determinants of Health   Financial Resource Strain: Not on file  Food Insecurity: Not on file  Transportation Needs: Not on file  Physical Activity: Not on file  Stress: Not on file  Social Connections: Not on file  Intimate Partner Violence: Not on file    FAMILY HISTORY: No family history on file.  ALLERGIES:  has No Known Allergies.  MEDICATIONS:  Current Outpatient Medications  Medication Sig Dispense Refill   Prenatal Vit-Fe Fumarate-FA (PRENATAL VITAMIN AND MINERAL PO) Take 1 tablet by  mouth daily.     ferrous sulfate 325 (65 FE) MG EC tablet Take 325 mg by mouth daily with breakfast. (Patient not taking: Reported on 12/23/2022)     No current facility-administered medications for this visit.    REVIEW OF SYSTEMS:   Constitutional: ( - ) fevers, ( - )  chills , ( - ) night sweats Eyes: ( - ) blurriness of vision, ( - ) double vision, ( - ) watery eyes Ears, nose, mouth, throat, and face: ( - ) mucositis, ( - ) sore throat Respiratory: ( - ) cough, ( - ) dyspnea, ( - ) wheezes Cardiovascular: ( - ) palpitation, ( - ) chest discomfort, ( - ) lower extremity swelling Gastrointestinal:  ( - ) nausea, ( - ) heartburn, ( - ) change in bowel habits Skin: ( - ) abnormal skin rashes Lymphatics: ( - ) new lymphadenopathy, ( - ) easy bruising Neurological: ( - ) numbness, ( - ) tingling, ( - ) new weaknesses Behavioral/Psych: ( - ) mood change, ( - ) new changes  All other systems were reviewed with the patient and are negative.  PHYSICAL EXAMINATION:  Vitals:   12/23/22 1314  BP: 136/85  Pulse: (!) 101  Resp: 17  Temp: 98.2 F (36.8 C)  SpO2: 100%   Filed Weights   12/23/22 1314  Weight: 237 lb 8 oz (107.7 kg)    GENERAL: well appearing middle-aged African-American Kimberly [name], currently pregnant.  In NAD  SKIN: skin color, texture, turgor are normal, no rashes or significant lesions EYES: conjunctiva are pink and non-injected, sclera clear LUNGS: clear to auscultation and percussion with normal breathing effort HEART: regular rate & rhythm and no murmurs and no lower extremity edema Musculoskeletal: no cyanosis of digits and no clubbing  PSYCH: alert & oriented x 3, fluent speech NEURO: no focal motor/sensory deficits  LABORATORY DATA:  I have reviewed the data as listed    Latest Ref Rng & Units 12/23/2022    2:09 PM 11/03/2020    8:37 AM 03/22/2013    7:51 PM  CBC  WBC 4.0 - 10.5 K/uL 9.0  5.7  9.4   Hemoglobin 12.0 - 15.0 g/dL 10.0  8.2  9.5   Hematocrit 36.0 -  46.0 % 32.7  29.4  32.7   Platelets 150 - 400 K/uL 284  396         Latest Ref Rng & Units 12/23/2022    2:09 PM 11/03/2020    8:37 AM 01/04/2013    5:37 PM  CMP  Glucose 70 - 99 mg/dL 83  98  87   BUN 6 - 20 mg/dL '7  9  12   '$ Creatinine 0.44 - 1.00 mg/dL 0.65  0.73  0.80   Sodium 135 - 145 mmol/L 132  138  136  Potassium 3.5 - 5.1 mmol/L 3.9  4.6  4.1   Chloride 98 - 111 mmol/L 103  106  105   CO2 22 - 32 mmol/L '23  26  22   '$ Calcium 8.9 - 10.3 mg/dL 9.2  9.0  9.3   Total Protein 6.5 - 8.1 g/dL 7.1  7.1  7.8   Total Bilirubin 0.3 - 1.2 mg/dL 0.2  0.5  0.2   Alkaline Phos 38 - 126 U/L 68  77  109   AST 15 - 41 U/L '10  12  13   '$ ALT 0 - 44 U/L '9  14  13      '$ ASSESSMENT & PLAN Kimberly Kimberly [name] 37 y.o. Kimberly [name] with medical history significant for fibroids who presents for evaluation of iron deficiency anemia in pregnancy.   After review of the labs, review of the records, and discussion with the patient the patients findings are most consistent with iron deficiency anemia secondary to menorrhagia, complicating pregnancy.  # Iron Deficiency Anemia 2/2 to GYN Bleeding # Iron Deficiency in Pregnancy -- Findings are consistent with iron deficiency anemia secondary to patient's menorrhagia --Encouraged her to follow-up with OB/GYN for better control of her menstrual cycles after delivery.  --We will confirm iron deficiency anemia by ordering iron panel and ferritin as well as reticulocytes, CBC, and CMP --We will plan to proceed with IV iron therapy in order to help bolster the patient's blood counts as IV iron is preferred in pregnancy. Do not recommend PO iron therapy.  --Plan for return to clinic in 4 to 6 weeks time after last dose of IV iron   Orders Placed This Encounter  Procedures   CBC with Differential ([place] Only)    Standing Status:   Future    Number of Occurrences:   1    Standing Expiration Date:   12/24/2023   CMP ([place] only)    Standing Status:   Future     Number of Occurrences:   1    Standing Expiration Date:   12/24/2023   Iron and Iron Binding Capacity ([facility] only)    Standing Status:   Future    Number of Occurrences:   1    Standing Expiration Date:   12/24/2023   Ferritin    Standing Status:   Future    Number of Occurrences:   1    Standing Expiration Date:   12/24/2023   Retic Panel    Standing Status:   Future    Number of Occurrences:   1    Standing Expiration Date:   12/24/2023    All questions were answered. The patient knows to call the clinic with any problems, questions or concerns.  A total of more than 60 minutes were spent on this encounter with face-to-face time and non-face-to-face time, including preparing to see the patient, ordering tests and/or medications, counseling the patient and coordination of care as outlined above.   Ledell Peoples, MD Department of Hematology/Oncology [place] [facility] Phone: [phone] Pager: [phone] Email: [email or web address]  01/05/2023 5:25 PM

## 2022-12-25 ENCOUNTER — Telehealth: Payer: Self-pay

## 2022-12-25 NOTE — Telephone Encounter (Signed)
Per Dr. Lorenso Courier, spoke with [facility] to schedule two doses of Feraheme. Spoke with patient to advise that apt for 12/31/22 @ 11 AM has been scheduled. Provided patient with number to [facility].  Encouraged patient to call with any questions or concerns.

## 2022-12-31 ENCOUNTER — Encounter (HOSPITAL_COMMUNITY): Payer: BC Managed Care – PPO

## 2023-01-07 ENCOUNTER — Encounter (HOSPITAL_COMMUNITY): Payer: BC Managed Care – PPO

## 2023-01-17 ENCOUNTER — Encounter (HOSPITAL_COMMUNITY)
Admission: RE | Admit: 2023-01-17 | Discharge: 2023-01-17 | Disposition: A | Discharge status: Home / Self Care | Payer: BC Managed Care – PPO | Source: Ambulatory Visit | Attending: Hematology and Oncology | Admitting: Hematology and Oncology

## 2023-01-17 DIAGNOSIS — D500 Iron deficiency anemia secondary to blood loss (chronic): Secondary | ICD-10-CM | POA: Insufficient documentation

## 2023-01-17 MED ORDER — SODIUM CHLORIDE 0.9 % IV SOLN
510.0000 mg | INTRAVENOUS | Status: DC
  Administered 2023-01-17: 510 mg via INTRAVENOUS
  Filled 2023-01-17: qty 510

## 2023-01-24 ENCOUNTER — Encounter (HOSPITAL_COMMUNITY)
Admission: RE | Admit: 2023-01-24 | Discharge: 2023-01-24 | Disposition: A | Discharge status: Home / Self Care | Payer: BC Managed Care – PPO | Source: Ambulatory Visit | Attending: Hematology and Oncology | Admitting: Hematology and Oncology

## 2023-01-24 DIAGNOSIS — D500 Iron deficiency anemia secondary to blood loss (chronic): Secondary | ICD-10-CM

## 2023-01-24 MED ORDER — SODIUM CHLORIDE 0.9 % IV SOLN
510.0000 mg | INTRAVENOUS | Status: DC
  Administered 2023-01-24: 510 mg via INTRAVENOUS
  Filled 2023-01-24: qty 510

## 2023-02-20 ENCOUNTER — Other Ambulatory Visit: Payer: Self-pay | Admitting: Physician Assistant

## 2023-02-20 DIAGNOSIS — D500 Iron deficiency anemia secondary to blood loss (chronic): Secondary | ICD-10-CM

## 2023-02-21 ENCOUNTER — Inpatient Hospital Stay: Payer: BC Managed Care – PPO | Admitting: Physician Assistant

## 2023-02-21 ENCOUNTER — Inpatient Hospital Stay: Payer: BC Managed Care – PPO | Attending: Hematology and Oncology

## 2023-05-22 LAB — OB RESULTS CONSOLE GBS: GBS: NEGATIVE

## 2023-06-02 ENCOUNTER — Encounter (HOSPITAL_COMMUNITY): Payer: Self-pay | Admitting: *Deleted

## 2023-06-02 ENCOUNTER — Encounter (HOSPITAL_COMMUNITY): Payer: Self-pay

## 2023-06-02 NOTE — Patient Instructions (Signed)
Kimberly [name]  06/02/2023   Your procedure is scheduled on:  06/12/2023  Arrive at 0530 at Entrance C on [employer] at [facility]  and [address]. You are invited to use the FREE valet parking or use the Visitor's parking deck.  Pick up the phone at the desk and dial [phone].  Call this number if you have problems the morning of surgery: [phone]  Remember:   Do not eat food:(After Midnight) Desps de medianoche.  Do not drink clear liquids: (After Midnight) Desps de medianoche.  Take these medicines the morning of surgery with A SIP OF WATER:  none   Do not wear jewelry, make-up or nail polish.  Do not wear lotions, powders, or perfumes. Do not wear deodorant.  Do not shave 48 hours prior to surgery.  Do not bring valuables to the hospital.  [facility] is not   responsible for any belongings or valuables brought to the hospital.  Contacts, dentures or bridgework may not be worn into surgery.  Leave suitcase in the car. After surgery it may be brought to your room.  For patients admitted to the hospital, checkout time is 11:00 AM the day of              discharge.      Please read over the following fact sheets that you were given:     Preparing for Surgery

## 2023-06-04 NOTE — H&P (Signed)
Kimberly [name] is a 37 y.o. female G1 at 64 weeks presenting for primary C/S for breech presentation.  Patient declined ECV attempt.  Last u/s 6/6 with EFW 6#8 (48%) with polyhydramnios and breech presentation.  Patient has known fibroids with last measurement 5/16: 12 x 9 cm, 9 x 8 cm and 6 x 4 cm.  Patient has h/o CHTN with normal BPs this pregnancy on no medication.  Patient has h/o goiter; normal TFTs.  Patient has h/o anemia likely secondary to menorrhagia.  Patient's Hgb has been stable this pregnancy.  She has been followed by hematology and is s/p iron infusion x 2 in second trimester.  AMA and declined aneuploidy screen.  GBS negative.    OB History     Gravida  1   Para      Term      Preterm      AB      Living         SAB      IAB      Ectopic      Multiple      Live Births             Past Medical History:  Diagnosis Date   Anemia    Heart murmur    ?    Past Surgical History:  Procedure Laterality Date   DILATATION & CURETTAGE/HYSTEROSCOPY WITH MYOSURE N/A 11/14/2020   Procedure: HYSTEROSCOPY, DIALATION AND CURRETAGE WITH  MYOSURE DEVICE;  Surgeon: Mitchel Honour, DO;  Location: [facility];  Service: Gynecology;  Laterality: N/A;   NO PAST SURGERIES     Family History: family history is not on file. Social History:  reports that she has never smoked. She has never used smokeless tobacco. She reports current alcohol use. She reports that she does not use drugs.     Maternal Diabetes: No Genetic Screening: Declined Maternal Ultrasounds/Referrals: Normal Fetal Ultrasounds or other Referrals:  None Maternal Substance Abuse:  No Significant Maternal Medications:  None Significant Maternal Lab Results:  Group B Strep negative Number of Prenatal Visits:greater than 3 verified prenatal visits Other Comments:  None  Review of Systems Maternal Medical History:  Prenatal complications: PIH and polyhydramnios.   Prenatal Complications -  Diabetes: none.     There were no vitals taken for this visit. Maternal Exam:  Abdomen: Patient reports no abdominal tenderness. Fundal height is S>D (fibroids).   Estimated fetal weight is 7#6.   Fetal presentation: breech   Physical Exam Constitutional:      Appearance: Normal appearance.  HENT:     Head: Normocephalic and atraumatic.  Pulmonary:     Effort: Pulmonary effort is normal.  Abdominal:     Palpations: Abdomen is soft.  Musculoskeletal:        General: Normal range of motion.     Cervical back: Normal range of motion.  Skin:    General: Skin is warm and dry.  Neurological:     Mental Status: She is alert and oriented to person, place, and time.  Psychiatric:        Mood and Affect: Mood normal.        Behavior: Behavior normal.     Prenatal labs: ABO, Rh: O/Positive/-- (12/07 0000) Antibody: Negative (12/07 0000) Rubella: Immune (12/11 0000) RPR: Nonreactive (12/11 0000)  HBsAg: Negative (12/11 0000)  HIV: Non-reactive (12/11 0000)  GBS: Negative/-- (06/06 0000)   Assessment/Plan: 36yo G1 at 39 weeks with breech presentation, large uterine  leiomyomata -Patient offered ECV and declines -Primary C/S -Patient is counseled re: risk of bleeding (which is great due to her large fibroids), infection, scarring and damage to surrounding structures.  She is informed of steps of procedure as well as postop expectations and limitations.  All questions were answered and patient wishes to proceed.   Mitchel Honour 06/04/2023, 12:20 PM

## 2023-06-10 ENCOUNTER — Encounter (HOSPITAL_COMMUNITY)
Admission: RE | Admit: 2023-06-10 | Discharge: 2023-06-10 | Disposition: A | Discharge status: Home / Self Care | Payer: BC Managed Care – PPO | Source: Ambulatory Visit | Attending: Obstetrics & Gynecology | Admitting: Obstetrics & Gynecology

## 2023-06-10 DIAGNOSIS — O321XX0 Maternal care for breech presentation, not applicable or unspecified: Secondary | ICD-10-CM | POA: Insufficient documentation

## 2023-06-10 LAB — CBC
HCT: 39 % (ref 36.0–46.0)
Hemoglobin: 12.8 g/dL (ref 12.0–15.0)
MCH: 28.6 pg (ref 26.0–34.0)
MCHC: 32.8 g/dL (ref 30.0–36.0)
MCV: 87.1 fL (ref 80.0–100.0)
Platelets: 215 10*3/uL (ref 150–400)
RBC: 4.48 MIL/uL (ref 3.87–5.11)
RDW: 13.5 % (ref 11.5–15.5)
WBC: 7.7 10*3/uL (ref 4.0–10.5)
nRBC: 0 % (ref 0.0–0.2)

## 2023-06-10 LAB — RPR: RPR Ser Ql: NONREACTIVE

## 2023-06-12 ENCOUNTER — Inpatient Hospital Stay (HOSPITAL_COMMUNITY): Payer: BC Managed Care – PPO

## 2023-06-12 ENCOUNTER — Encounter (HOSPITAL_COMMUNITY)
Admission: AD | Disposition: A | Discharge status: Home / Self Care | Payer: Self-pay | Source: Home / Self Care | Attending: Obstetrics & Gynecology

## 2023-06-12 ENCOUNTER — Encounter (HOSPITAL_COMMUNITY): Payer: Self-pay | Admitting: Obstetrics & Gynecology

## 2023-06-12 ENCOUNTER — Inpatient Hospital Stay (HOSPITAL_COMMUNITY)
Admission: AD | Admit: 2023-06-12 | Discharge: 2023-06-14 | DRG: 788 | Disposition: A | Discharge status: Home / Self Care | Payer: BC Managed Care – PPO | Attending: Obstetrics & Gynecology | Admitting: Obstetrics & Gynecology

## 2023-06-12 ENCOUNTER — Other Ambulatory Visit: Payer: Self-pay

## 2023-06-12 DIAGNOSIS — O3413 Maternal care for benign tumor of corpus uteri, third trimester: Secondary | ICD-10-CM | POA: Diagnosis present

## 2023-06-12 DIAGNOSIS — D250 Submucous leiomyoma of uterus: Secondary | ICD-10-CM | POA: Diagnosis present

## 2023-06-12 DIAGNOSIS — D251 Intramural leiomyoma of uterus: Secondary | ICD-10-CM | POA: Diagnosis present

## 2023-06-12 DIAGNOSIS — O321XX0 Maternal care for breech presentation, not applicable or unspecified: Secondary | ICD-10-CM | POA: Diagnosis present

## 2023-06-12 DIAGNOSIS — R030 Elevated blood-pressure reading, without diagnosis of hypertension: Secondary | ICD-10-CM | POA: Diagnosis not present

## 2023-06-12 DIAGNOSIS — Z3A39 39 weeks gestation of pregnancy: Secondary | ICD-10-CM

## 2023-06-12 DIAGNOSIS — Z98891 History of uterine scar from previous surgery: Secondary | ICD-10-CM

## 2023-06-12 DIAGNOSIS — D252 Subserosal leiomyoma of uterus: Secondary | ICD-10-CM | POA: Diagnosis present

## 2023-06-12 DIAGNOSIS — O99893 Other specified diseases and conditions complicating puerperium: Secondary | ICD-10-CM | POA: Diagnosis not present

## 2023-06-12 DIAGNOSIS — O99214 Obesity complicating childbirth: Secondary | ICD-10-CM | POA: Diagnosis present

## 2023-06-12 LAB — PREPARE RBC (CROSSMATCH)

## 2023-06-12 SURGERY — Surgical Case
Anesthesia: Spinal

## 2023-06-12 MED ORDER — LIDOCAINE HCL (PF) 1 % IJ SOLN
INTRAMUSCULAR | Status: AC
  Filled 2023-06-12: qty 5

## 2023-06-12 MED ORDER — OXYTOCIN-SODIUM CHLORIDE 30-0.9 UT/500ML-% IV SOLN: 2.5000 [IU]/h | INTRAVENOUS | Status: AC

## 2023-06-12 MED ORDER — DIPHENHYDRAMINE HCL 25 MG PO CAPS: 25.0000 mg | ORAL_CAPSULE | ORAL | Status: DC | PRN

## 2023-06-12 MED ORDER — FERROUS SULFATE 325 (65 FE) MG PO TABS
325.0000 mg | ORAL_TABLET | ORAL | Status: DC
  Administered 2023-06-14: 325 mg via ORAL
  Filled 2023-06-12 (×2): qty 1

## 2023-06-12 MED ORDER — MORPHINE SULFATE (PF) 0.5 MG/ML IJ SOLN
INTRAMUSCULAR | Status: AC
  Filled 2023-06-12: qty 10

## 2023-06-12 MED ORDER — SENNOSIDES-DOCUSATE SODIUM 8.6-50 MG PO TABS
2.0000 | ORAL_TABLET | Freq: Every day | ORAL | Status: DC
  Administered 2023-06-13 – 2023-06-14 (×2): 2 via ORAL
  Filled 2023-06-12 (×2): qty 2

## 2023-06-12 MED ORDER — DEXAMETHASONE SODIUM PHOSPHATE 10 MG/ML IJ SOLN
INTRAMUSCULAR | Status: DC | PRN
  Administered 2023-06-12 (×2): 5 mg via INTRAVENOUS

## 2023-06-12 MED ORDER — SIMETHICONE 80 MG PO CHEW
80.0000 mg | CHEWABLE_TABLET | Freq: Three times a day (TID) | ORAL | Status: DC
  Administered 2023-06-12 – 2023-06-14 (×6): 80 mg via ORAL
  Filled 2023-06-12 (×6): qty 1

## 2023-06-12 MED ORDER — BUPIVACAINE IN DEXTROSE 0.75-8.25 % IT SOLN
INTRATHECAL | Status: DC | PRN
  Administered 2023-06-12: 1.6 mL via INTRATHECAL

## 2023-06-12 MED ORDER — ACETAMINOPHEN 500 MG PO TABS
1000.0000 mg | ORAL_TABLET | Freq: Four times a day (QID) | ORAL | Status: DC
  Administered 2023-06-12 – 2023-06-14 (×8): 1000 mg via ORAL
  Filled 2023-06-12 (×8): qty 2

## 2023-06-12 MED ORDER — CEFAZOLIN SODIUM-DEXTROSE 2-4 GM/100ML-% IV SOLN
2.0000 g | INTRAVENOUS | Status: AC
  Administered 2023-06-12: 2 mg via INTRAVENOUS

## 2023-06-12 MED ORDER — OXYTOCIN-SODIUM CHLORIDE 30-0.9 UT/500ML-% IV SOLN
INTRAVENOUS | Status: DC | PRN
  Administered 2023-06-12: 200 mL via INTRAVENOUS
  Administered 2023-06-12: 300 mL via INTRAVENOUS

## 2023-06-12 MED ORDER — PHENYLEPHRINE HCL-NACL 20-0.9 MG/250ML-% IV SOLN
INTRAVENOUS | Status: AC
  Filled 2023-06-12: qty 250

## 2023-06-12 MED ORDER — CEFAZOLIN SODIUM-DEXTROSE 2-4 GM/100ML-% IV SOLN
INTRAVENOUS | Status: AC
  Filled 2023-06-12: qty 100

## 2023-06-12 MED ORDER — SODIUM CHLORIDE 0.9% FLUSH: 3.0000 mL | INTRAVENOUS | Status: DC | PRN

## 2023-06-12 MED ORDER — SIMETHICONE 80 MG PO CHEW: 80.0000 mg | CHEWABLE_TABLET | ORAL | Status: DC | PRN

## 2023-06-12 MED ORDER — DEXAMETHASONE SODIUM PHOSPHATE 10 MG/ML IJ SOLN
INTRAMUSCULAR | Status: AC
  Filled 2023-06-12: qty 1

## 2023-06-12 MED ORDER — ACETAMINOPHEN 10 MG/ML IV SOLN
INTRAVENOUS | Status: DC | PRN
  Administered 2023-06-12: 1000 mg via INTRAVENOUS

## 2023-06-12 MED ORDER — DEXMEDETOMIDINE HCL IN NACL 80 MCG/20ML IV SOLN
INTRAVENOUS | Status: AC
  Filled 2023-06-12: qty 20

## 2023-06-12 MED ORDER — TRANEXAMIC ACID-NACL 1000-0.7 MG/100ML-% IV SOLN
INTRAVENOUS | Status: DC | PRN
  Administered 2023-06-12: 1000 mg via INTRAVENOUS

## 2023-06-12 MED ORDER — MEPERIDINE HCL 25 MG/ML IJ SOLN: 6.2500 mg | INTRAMUSCULAR | Status: DC | PRN

## 2023-06-12 MED ORDER — ONDANSETRON HCL 4 MG/2ML IJ SOLN: 4.0000 mg | Freq: Three times a day (TID) | INTRAMUSCULAR | Status: DC | PRN

## 2023-06-12 MED ORDER — WITCH HAZEL-GLYCERIN EX PADS: 1.0000 | MEDICATED_PAD | CUTANEOUS | Status: DC | PRN

## 2023-06-12 MED ORDER — DIPHENHYDRAMINE HCL 50 MG/ML IJ SOLN: 12.5000 mg | INTRAMUSCULAR | Status: DC | PRN

## 2023-06-12 MED ORDER — LACTATED RINGERS IV SOLN: INTRAVENOUS | Status: DC

## 2023-06-12 MED ORDER — MENTHOL 3 MG MT LOZG: 1.0000 | LOZENGE | OROMUCOSAL | Status: DC | PRN

## 2023-06-12 MED ORDER — POVIDONE-IODINE 10 % EX SWAB
2.0000 | Freq: Once | CUTANEOUS | Status: AC
  Administered 2023-06-12: 2 via TOPICAL

## 2023-06-12 MED ORDER — OXYCODONE HCL 5 MG PO TABS: 5.0000 mg | ORAL_TABLET | ORAL | Status: DC | PRN

## 2023-06-12 MED ORDER — MORPHINE SULFATE (PF) 0.5 MG/ML IJ SOLN
INTRAMUSCULAR | Status: DC | PRN
  Administered 2023-06-12: 150 ug via INTRATHECAL

## 2023-06-12 MED ORDER — NALOXONE HCL 4 MG/10ML IJ SOLN: 1.0000 ug/kg/h | INTRAVENOUS | Status: DC | PRN

## 2023-06-12 MED ORDER — PRENATAL MULTIVITAMIN CH
1.0000 | ORAL_TABLET | Freq: Every day | ORAL | Status: DC
  Administered 2023-06-13 – 2023-06-14 (×2): 1 via ORAL
  Filled 2023-06-12 (×2): qty 1

## 2023-06-12 MED ORDER — ZOLPIDEM TARTRATE 5 MG PO TABS: 5.0000 mg | ORAL_TABLET | Freq: Every evening | ORAL | Status: DC | PRN

## 2023-06-12 MED ORDER — NALOXONE HCL 0.4 MG/ML IJ SOLN: 0.4000 mg | INTRAMUSCULAR | Status: DC | PRN

## 2023-06-12 MED ORDER — KETOROLAC TROMETHAMINE 30 MG/ML IJ SOLN
30.0000 mg | Freq: Once | INTRAMUSCULAR | Status: AC | PRN
  Administered 2023-06-12: 30 mg via INTRAVENOUS

## 2023-06-12 MED ORDER — DEXMEDETOMIDINE HCL IN NACL 80 MCG/20ML IV SOLN
INTRAVENOUS | Status: DC | PRN
  Administered 2023-06-12: 4 ug via INTRAVENOUS
  Administered 2023-06-12 (×3): 8 ug via INTRAVENOUS

## 2023-06-12 MED ORDER — KETOROLAC TROMETHAMINE 30 MG/ML IJ SOLN
INTRAMUSCULAR | Status: AC
  Filled 2023-06-12: qty 1

## 2023-06-12 MED ORDER — ONDANSETRON HCL 4 MG/2ML IJ SOLN
INTRAMUSCULAR | Status: DC | PRN
  Administered 2023-06-12: 4 mg via INTRAVENOUS

## 2023-06-12 MED ORDER — PHENYLEPHRINE HCL-NACL 20-0.9 MG/250ML-% IV SOLN
INTRAVENOUS | Status: DC | PRN
  Administered 2023-06-12: 80 ug/min via INTRAVENOUS

## 2023-06-12 MED ORDER — COCONUT OIL OIL: 1.0000 | TOPICAL_OIL | Status: DC | PRN

## 2023-06-12 MED ORDER — FENTANYL CITRATE (PF) 100 MCG/2ML IJ SOLN
INTRAMUSCULAR | Status: AC
  Filled 2023-06-12: qty 2

## 2023-06-12 MED ORDER — DIPHENHYDRAMINE HCL 25 MG PO CAPS: 25.0000 mg | ORAL_CAPSULE | Freq: Four times a day (QID) | ORAL | Status: DC | PRN

## 2023-06-12 MED ORDER — IBUPROFEN 600 MG PO TABS
600.0000 mg | ORAL_TABLET | Freq: Four times a day (QID) | ORAL | Status: DC
  Administered 2023-06-12 – 2023-06-14 (×8): 600 mg via ORAL
  Filled 2023-06-12 (×8): qty 1

## 2023-06-12 MED ORDER — ONDANSETRON HCL 4 MG/2ML IJ SOLN
INTRAMUSCULAR | Status: AC
  Filled 2023-06-12: qty 2

## 2023-06-12 MED ORDER — FENTANYL CITRATE (PF) 100 MCG/2ML IJ SOLN
INTRAMUSCULAR | Status: DC | PRN
  Administered 2023-06-12: 15 ug via INTRATHECAL

## 2023-06-12 MED ORDER — SCOPOLAMINE 1 MG/3DAYS TD PT72: 1.0000 | MEDICATED_PATCH | Freq: Once | TRANSDERMAL | Status: DC

## 2023-06-12 MED ORDER — DIBUCAINE (PERIANAL) 1 % EX OINT: 1.0000 | TOPICAL_OINTMENT | CUTANEOUS | Status: DC | PRN

## 2023-06-12 SURGICAL SUPPLY — 41 items
ADH SKN CLS APL DERMABOND .7 (GAUZE/BANDAGES/DRESSINGS)
APL PRP STRL LF DISP 70% ISPRP (MISCELLANEOUS) ×2
APL SKNCLS NONHYPOALLERGENIC (GAUZE/BANDAGES/DRESSINGS) ×1
APL SKNCLS STERI-STRIP NONHPOA (GAUZE/BANDAGES/DRESSINGS) ×1
BENZOIN TINCTURE PRP APPL 2/3 (GAUZE/BANDAGES/DRESSINGS) ×2 IMPLANT
CHLORAPREP W/TINT 26 (MISCELLANEOUS) ×4 IMPLANT
CLAMP UMBILICAL CORD (MISCELLANEOUS) ×2 IMPLANT
CLOTH BEACON ORANGE TIMEOUT ST (SAFETY) ×2 IMPLANT
DERMABOND ADVANCED .7 DNX12 (GAUZE/BANDAGES/DRESSINGS) IMPLANT
DRSG OPSITE POSTOP 4X10 (GAUZE/BANDAGES/DRESSINGS) ×2 IMPLANT
ELECT REM PT RETURN 9FT ADLT (ELECTROSURGICAL) ×1
ELECTRODE REM PT RTRN 9FT ADLT (ELECTROSURGICAL) ×2 IMPLANT
EXTRACTOR VACUUM KIWI (MISCELLANEOUS) IMPLANT
GAUZE SPONGE 4X4 12PLY STRL (GAUZE/BANDAGES/DRESSINGS) IMPLANT
GAUZE SPONGE 4X4 12PLY STRL LF (GAUZE/BANDAGES/DRESSINGS) IMPLANT
GLOVE BIO SURGEON STRL SZ 6 (GLOVE) ×2 IMPLANT
GLOVE BIOGEL PI IND STRL 6 (GLOVE) ×4 IMPLANT
GLOVE BIOGEL PI IND STRL 7.0 (GLOVE) ×2 IMPLANT
GOWN STRL REUS W/TWL LRG LVL3 (GOWN DISPOSABLE) ×4 IMPLANT
KIT ABG SYR 3ML LUER SLIP (SYRINGE) ×2 IMPLANT
MAT PREVALON FULL STRYKER (MISCELLANEOUS) IMPLANT
NDL HYPO 25X5/8 SAFETYGLIDE (NEEDLE) ×2 IMPLANT
NEEDLE HYPO 25X5/8 SAFETYGLIDE (NEEDLE) ×1 IMPLANT
NS IRRIG 1000ML POUR BTL (IV SOLUTION) ×2 IMPLANT
PACK C SECTION WH (CUSTOM PROCEDURE TRAY) ×2 IMPLANT
PAD ABD 8X10 STRL (GAUZE/BANDAGES/DRESSINGS) IMPLANT
PAD OB MATERNITY 4.3X12.25 (PERSONAL CARE ITEMS) ×2 IMPLANT
STRIP CLOSURE SKIN 1/2X4 (GAUZE/BANDAGES/DRESSINGS) IMPLANT
SUT CHROMIC 0 CTX 36 (SUTURE) ×6 IMPLANT
SUT MON AB 2-0 CT1 27 (SUTURE) ×2 IMPLANT
SUT PDS AB 0 CT1 27 (SUTURE) IMPLANT
SUT PLAIN 0 NONE (SUTURE) IMPLANT
SUT VIC AB 0 CT1 27 (SUTURE) ×2
SUT VIC AB 0 CT1 27XBRD ANBCTR (SUTURE) IMPLANT
SUT VIC AB 0 CT1 36 (SUTURE) IMPLANT
SUT VIC AB 2-0 CT1 27 (SUTURE) ×2
SUT VIC AB 2-0 CT1 TAPERPNT 27 (SUTURE) IMPLANT
SUT VIC AB 4-0 KS 27 (SUTURE) IMPLANT
TOWEL OR 17X24 6PK STRL BLUE (TOWEL DISPOSABLE) ×2 IMPLANT
TRAY FOLEY W/BAG SLVR 14FR LF (SET/KITS/TRAYS/PACK) IMPLANT
WATER STERILE IRR 1000ML POUR (IV SOLUTION) ×2 IMPLANT

## 2023-06-12 NOTE — Lactation Note (Signed)
This note was copied from a baby's chart. Lactation Consultation Note  Patient Name: Boy Germaine Ripp [id number] Date: 06/12/2023 Age:36 hours Reason for consult: Initial assessment;1st time breastfeeding;Primapara (See Birth Parent -MR: hx Anemia, CHTN, AMA and C/S delivery.).  Infant was cuing to BF when LC entered the room, Birth Parent did reverse pressure softening colostrum present, infant latched on Birth Parent's right breast, sustained latch and BF for 8 minutes. Birth Parent was taught hand expression and infant given 2 mls of colostrum. Birth Parent knows she can do reverse pressure soften or use hand pump to pre-pump breast prior to latching infant to help evert nipple shaft out more. Birth Parent was given hand pump with 24 mm breast flange to pre-pump breast prior to latching infant if needed.  LC discussed infant's input and output and infant had stools since delivery. LC discussed importance of maternal weight, diet and hydration. Birth Parent was  made aware of O/P services, breastfeeding support groups, community resources, and our phone # for post-discharge questions.    Current Feeding plan: 1- Birth Parent can do reverse pressure soften or pre-pump breast prior to latching infant to help evert nipple shaft out more, Continue to BF infant by cues, on demand, 8 to 12+ times within 24 hours, skin to skin. 2-Call RN/LC for latch assistance if needed. 3- Birth Parent knows if infant doesn't latch to hand express and do lots of skin to skin with infant.  Maternal Data Has patient been taught Hand Expression?: Yes Does the patient have breastfeeding experience prior to this delivery?: No  Feeding Mother's Current Feeding Choice: Breast Milk  LATCH Score Latch: Grasps breast easily, tongue down, lips flanged, rhythmical sucking.  Audible Swallowing: Spontaneous and intermittent  Type of Nipple: Flat  Comfort (Breast/Nipple): Soft / non-tender  Hold (Positioning): Assistance needed  to correctly position infant at breast and maintain latch.  LATCH Score: 8   Lactation Tools Discussed/Used Tools: Pump Breast pump type: Manual Pump Education: Setup, frequency, and cleaning;Milk Storage Reason for Pumping: Birth Parent nipples are flat with little areola edema Pumping frequency: pre-pump prior to latch  Interventions Interventions: Breast feeding basics reviewed;Assisted with latch;Support pillows;Adjust position;Skin to skin;Position options;Expressed milk;Breast compression;Education  Discharge Pump: DEBP (Per Birth Parent, ordered with insurance)  Consult Status Consult Status: Follow-up Date: 06/13/23 Follow-up type: In-patient    Frederico Hamman 06/12/2023, 4:24 PM

## 2023-06-12 NOTE — Anesthesia Preprocedure Evaluation (Signed)
Anesthesia Evaluation  Patient identified by MRN, date of birth, ID band Patient awake    Reviewed: Allergy & Precautions, NPO status , Patient's Chart, lab work & pertinent test results  Airway Mallampati: III  TM Distance: >3 FB Neck ROM: Full    Dental no notable dental hx.    Pulmonary neg pulmonary ROS   Pulmonary exam normal        Cardiovascular negative cardio ROS  Rhythm:Regular Rate:Normal     Neuro/Psych negative neurological ROS  negative psych ROS   GI/Hepatic negative GI ROS, Neg liver ROS,,,  Endo/Other  negative endocrine ROS  Morbid obesity  Renal/GU negative Renal ROS  negative genitourinary   Musculoskeletal negative musculoskeletal ROS (+)    Abdominal Normal abdominal exam  (+)   Peds  Hematology  (+) Blood dyscrasia, anemia   Anesthesia Other Findings   Reproductive/Obstetrics (+) Pregnancy                             Anesthesia Physical Anesthesia Plan  ASA: 3  Anesthesia Plan: Spinal   Post-op Pain Management:    Induction:   PONV Risk Score and Plan: 2 and Treatment may vary due to age or medical condition  Airway Management Planned: Natural Airway  Additional Equipment: None  Intra-op Plan:   Post-operative Plan:   Informed Consent: I have reviewed the patients History and Physical, chart, labs and discussed the procedure including the risks, benefits and alternatives for the proposed anesthesia with the patient or authorized representative who has indicated his/her understanding and acceptance.     Dental advisory given  Plan Discussed with:   Anesthesia Plan Comments:        Anesthesia Quick Evaluation

## 2023-06-12 NOTE — Op Note (Signed)
Adrian Saran PROCEDURE DATE: 06/12/2023  PREOPERATIVE DIAGNOSIS: Intrauterine pregnancy at  [redacted]w[redacted]d weeks gestation, breech presentation, large uterine fibroids  POSTOPERATIVE DIAGNOSIS: The same  PROCEDURE:  Primary High Transverse Cesarean Section  SURGEON:  Dr. Mitchel Honour  ASSISTANT:  Dr. Nilda Simmer  INDICATIONS: Kimberly [name] is a 37 y.o. G1P0 at [redacted]w[redacted]d scheduled for cesarean section secondary to breech presentation.  The risks of cesarean section discussed with the patient included but were not limited to: bleeding which may require transfusion or reoperation; infection which may require antibiotics; injury to bowel, bladder, ureters or other surrounding organs; injury to the fetus; need for additional procedures including hysterectomy in the event of a life-threatening hemorrhage; placental abnormalities wth subsequent pregnancies, incisional problems, thromboembolic phenomenon and other postoperative/anesthesia complications. The patient concurred with the proposed plan, giving informed written consent for the procedure.    FINDINGS:  Viable female infant in breech presentation, APGARs 3,9: weight 7#9  Clear and large volume amniotic fluid.  Intact placenta, three vessel cord.  Enlarged uterus secondary to multiple fibroids which were intramural and subserosal.  Normal ovaries and fallopian tubes. .   ANESTHESIA:  Spinal ESTIMATED BLOOD LOSS: 346 ml SPECIMENS: Placenta sent to L&D COMPLICATIONS: None immediate  PROCEDURE IN DETAIL:  The patient received intravenous antibiotics and had sequential compression devices applied to her lower extremities while in the preoperative area.  She was then taken to the operating room where spinal anesthesia was administered and was found to be adequate. She was then placed in a dorsal supine position with a leftward tilt, and prepped and draped in a sterile manner.  A foley catheter was placed into her bladder and attached to constant gravity.  After  an adequate timeout was performed, a Pfannenstiel skin incision was made with scalpel and carried through to the underlying layer of fascia. The fascia was incised in the midline and this incision was extended bilaterally using the Mayo scissors. Kocher clamps were applied to the superior aspect of the fascial incision and the underlying rectus muscles were dissected off bluntly. A similar process was carried out on the inferior aspect of the facial incision. The rectus muscles were separated in the midline bluntly and the peritoneum was entered bluntly.   TXA 1g IV was given at this time.  A high transverse hysterotomy was made with a scalpel in mid-uterine corpus secondary to large diameter veins in lower uterine segment.  This incision was extended bilaterally bluntly and with bandage scissors. The bladder blade was then removed. The infant was successfully delivered using typical breech maneuvers.  Delivery was challenging secondary to fibroids preventing direct fundal pressure.  The cord was clamped and cut and infant was handed over to awaiting neonatology team. Uterine massage was then administered and the placenta delivered intact with three-vessel cord. The uterus was cleared of clot and debris.  A 5 cm submucosal fibroid was incised on the right side of the hysterotomy on uterine entry.  This was closed using 3 figure of eight chromic stitches.  The hysterotomy was closed with 0 chromic in a running locked fashion.  A second imbricating suture of 0-Vicryl was used to reinforce the incision and aid in hemostasis.  An oozing venous sinus on the left side of the hysterotomy was rendered hemostatic using 2 figure of eight chromic stitches.  Irrigation was performed.  The peritoneum and rectus muscles were noted to be hemostatic and were reapproximated using 2-0 monocryl in a running fashion.  The fascia was  closed with 0-Vicryl in a running fashion with good restoration of anatomy.  The subcutaneus tissue was  copiously irrigated.  The skin was closed with 4-0 vicryl in a subcuticular fashion.  Pt tolerated the procedure will.  All counts were correct x2.  Pt went to the recovery room in stable condition.

## 2023-06-12 NOTE — Progress Notes (Signed)
No change to H&P.  Large leiomyomata.  Starting Hgb 12.8.  2u PRBCs available.  Plan TXA intra-op.  Patient is counseled re: higher liklihood of hemorrhage given large fibroids.  She is counseled that this puts her at increased risk of need for blood transfusion.  She is informed of possible but low risk of viral transmission as well as transfusion reaction.  She expresses understanding.    Mitchel Honour, DO

## 2023-06-12 NOTE — Anesthesia Procedure Notes (Signed)
Spinal  Patient location during procedure: OR Start time: 06/12/2023 7:55 AM End time: 06/12/2023 8:04 AM Staffing Performed: anesthesiologist  Anesthesiologist: Atilano Median, DO Performed by: Atilano Median, DO Authorized by: Atilano Median, DO   Preanesthetic Checklist Completed: patient identified, IV checked, site marked, risks and benefits discussed, surgical consent, monitors and equipment checked, pre-op evaluation and timeout performed Spinal Block Patient position: sitting Prep: DuraPrep Patient monitoring: heart rate, cardiac monitor, continuous pulse ox and blood pressure Approach: midline Location: L3-4 Injection technique: single-shot Needle Needle type: Quincke  Needle gauge: 22 G Needle length: 9 cm Assessment Events: CSF return and second provider Additional Notes - initial attempt by SRNA unsuccessful. Difficult placement by self despite alternate levels and positioning. Single attempt with Quincke +CSF, -heme  Patient identified. Risks/Benefits/Options discussed with patient including but not limited to bleeding, infection, nerve damage, paralysis, failed block, incomplete pain control, headache, blood pressure changes, nausea, vomiting, reactions to medications, itching and postpartum back pain. Confirmed with bedside nurse the patient's most recent platelet count. Confirmed with patient that they are not currently taking any anticoagulation, have any bleeding history or any family history of bleeding disorders. Patient expressed understanding and wished to proceed. All questions were answered. Sterile technique was used throughout the entire procedure. Please see nursing notes for vital signs. Warning signs of high block given to the patient including shortness of breath, tingling/numbness in hands, complete motor block, or any concerning symptoms with instructions to call for help. Patient was given instructions on fall risk and not to get out of  bed. All questions and concerns addressed with instructions to call with any issues or inadequate analgesia.

## 2023-06-12 NOTE — Anesthesia Postprocedure Evaluation (Signed)
Anesthesia Post Note  Patient: Kimberly [name]  Procedure(s) Performed: PRIMARY CESAREAN SECTION EDC: 06-14-23  ALLERG: NKDA     Patient location during evaluation: PACU Anesthesia Type: Spinal Level of consciousness: oriented and awake and alert Pain management: pain level controlled Vital Signs Assessment: post-procedure vital signs reviewed and stable Respiratory status: spontaneous breathing, respiratory function stable and patient connected to nasal cannula oxygen Cardiovascular status: blood pressure returned to baseline and stable Postop Assessment: no headache, no backache and no apparent nausea or vomiting Anesthetic complications: no   No notable events documented.  Last Vitals:  Vitals:   06/12/23 1326 06/12/23 1455  BP: 119/62 125/66  Pulse: (!) 57 (!) 54  Resp:  20  Temp:    SpO2: 95% 97%    Last Pain:  Vitals:   06/12/23 1455  TempSrc:   PainSc: 0-No pain                 Earl Lites P Kalah Pflum

## 2023-06-12 NOTE — Transfer of Care (Signed)
Immediate Anesthesia Transfer of Care Note  Patient: Kimberly [name]  Procedure(s) Performed: PRIMARY CESAREAN SECTION EDC: 06-14-23  ALLERG: NKDA  Patient Location: PACU  Anesthesia Type:Spinal  Level of Consciousness: awake  Airway & Oxygen Therapy: Patient Spontanous Breathing  Post-op Assessment: Report given to RN  Post vital signs: Reviewed and stable  Last Vitals:  Vitals Value Taken Time  BP 114/72 06/12/23 0943  Temp    Pulse 67 06/12/23 0945  Resp 25 06/12/23 0945  SpO2 97 % 06/12/23 0945  Vitals shown include unvalidated device data.  Last Pain:  Vitals:   06/12/23 0556  TempSrc:   PainSc: 0-No pain         Complications: No notable events documented.

## 2023-06-13 LAB — CBC
HCT: 35 % — ABNORMAL LOW (ref 36.0–46.0)
Hemoglobin: 11.5 g/dL — ABNORMAL LOW (ref 12.0–15.0)
MCH: 27.8 pg (ref 26.0–34.0)
MCHC: 32.9 g/dL (ref 30.0–36.0)
MCV: 84.5 fL (ref 80.0–100.0)
Platelets: 185 K/uL (ref 150–400)
RBC: 4.14 MIL/uL (ref 3.87–5.11)
RDW: 13.5 % (ref 11.5–15.5)
WBC: 10.2 K/uL (ref 4.0–10.5)
nRBC: 0 % (ref 0.0–0.2)

## 2023-06-13 NOTE — Progress Notes (Signed)
RN encouraged ambulation to help with passing gas and showering for removal of pressure dressing.

## 2023-06-13 NOTE — Progress Notes (Signed)
MD at bedside and informed of old dng on [facility] dsg. Decision to leave on til AM.

## 2023-06-13 NOTE — Progress Notes (Signed)
MOB's BP was slightly elevated this evening. 140/78 around 10 pm. Recheck was 138/86. Provider was contacted and no new orders at this time. Contact for BP's if within the severe range. Call for BP of 160 or greater systolic and/or 110 diastolic.

## 2023-06-13 NOTE — Lactation Note (Signed)
This note was copied from a baby's chart. Lactation Consultation Note  Patient Name: Kimberly [name] [id number] Date: 06/13/2023 Age:36 hours Reason for consult: 1st time breastfeeding;Follow-up assessment;Term;Infant weight loss (-3.64% weight loss).See Birth Parent -MR: hx Anemia, CHTN, AMA and C/S delivery. P1, term female infant. Per Birth Parent she feels breastfeeding is going well, infant is breastfeeding for 20 to 30 minutes most feeding, infant had 3 voids and 5 stools since birth. Birth Parent will continue to BF infant by cues, on demand, 8 to 12+ times within 24 hours, skin to skin. Birth Parent knows to call RN/LC for latch assistance if needed. LC reinforced maternal rest, diet and hydration. Birth Parent knows that infant is past 24 hours, he may cluster feed tonight and this is normal. LC did not observe latch with current feeding due infant BF for 20 minutes at 1720 pm.   Maternal Data    Feeding Mother's Current Feeding Choice: Breast Milk  LATCH Score LC did not observe latch with recent feeding.                    Lactation Tools Discussed/Used    Interventions Interventions: Position options;Skin to skin;Hand express;Education  Discharge    Consult Status Consult Status: Follow-up Date: 06/14/23 Follow-up type: In-patient    Frederico Hamman 06/13/2023, 6:09 PM

## 2023-06-13 NOTE — Progress Notes (Signed)
Postpartum Progress Note  Postpartum Day 1 s/p primary Cesarean section for breech, large uterine fibroids.  Subjective:  Patient reports no overnight events.  She reports well controlled pain, ambulating without difficulty, voiding spontaneously, tolerating PO.  She reports Negative flatus, Negative BM.  Vaginal bleeding is minimal.  Objective: Blood pressure 106/60, pulse (!) 53, temperature 98.1 F (36.7 C), temperature source Axillary, resp. rate 16, height 5\' 4"  (1.626 m), weight 115.3 kg, SpO2 98 %, unknown if currently breastfeeding.  Physical Exam:  General: alert and no distress Lochia: appropriate Abdomen: soft, ATTP Uterine Fundus: firm Incision: dressing in place DVT Evaluation: No evidence of DVT seen on physical exam.  Recent Labs    06/10/23 0858 06/13/23 0558  HGB 12.8 11.5*  HCT 39.0 35.0*    Assessment/Plan: Postpartum Day 1, s/p C-section Baby boy - desires circ. Will perform if cleared by nursery.  Lactation following Doing well, continue routine postpartum care. Anticipate discharge PPD#2 or 3   LOS: 1 day   Lyn Henri 06/13/2023, 7:40 AM

## 2023-06-14 LAB — TYPE AND SCREEN
ABO/RH(D): O POS
Antibody Screen: NEGATIVE
Unit division: 0
Unit division: 0

## 2023-06-14 LAB — BPAM RBC
Blood Product Expiration Date: 202407222359
Blood Product Expiration Date: 202407222359
ISSUE DATE / TIME: 202406262225
ISSUE DATE / TIME: 202406262225
Unit Type and Rh: 5100
Unit Type and Rh: 5100

## 2023-06-14 MED ORDER — DOCUSATE SODIUM 100 MG PO CAPS: 100.0000 mg | ORAL_CAPSULE | Freq: Every day | ORAL | 0 refills | Status: AC

## 2023-06-14 MED ORDER — FERROUS SULFATE 325 (65 FE) MG PO TABS: 325.0000 mg | ORAL_TABLET | ORAL | 3 refills | Status: DC

## 2023-06-14 MED ORDER — IBUPROFEN 600 MG PO TABS: 600.0000 mg | ORAL_TABLET | Freq: Four times a day (QID) | ORAL | 0 refills | Status: AC

## 2023-06-14 MED ORDER — ACETAMINOPHEN 325 MG PO TABS: 650.0000 mg | ORAL_TABLET | Freq: Four times a day (QID) | ORAL | 0 refills | Status: AC | PRN

## 2023-06-14 MED ORDER — OXYCODONE HCL 5 MG PO TABS: 5.0000 mg | ORAL_TABLET | ORAL | 0 refills | Status: AC | PRN

## 2023-06-14 NOTE — Lactation Note (Signed)
This note was copied from a baby's chart. Lactation Consultation Note  Patient Name: Kimberly [name] Date: 06/14/2023 Age:36 hours Reason for consult: Follow-up assessment;MD order;Primapara;1st time breastfeeding;Term  LC in to room prior to discharge. Infant is latched upon arrival. Observed deep latch, optimal positioning and neck/back support. Observed good sucks and swallows. Birthing parent reports some nipple discomfort. Talked about nipple care and provided coconut oil. Infant pops off and noted flattened nipple. Encouraged parent to keep infant awake at breast preventing a shallow latch. Discussed normal behavior and patterns after 24h, voids and stools as signs good intake, pumping, clusterfeeding, skin to skin. Talked about milk coming into volume and managing engorgement. Parent has a manual pump and expects her personal DEBP will arrive soon. Reviewed local resources available after discharge.   Plan: 1-Aim for a deep, comfortable latch, breastfeeding on demand or 8-12 times in 24h period. 2-Hand express/pump as needed for supplementation 3-Encouraged birthing parent rest, hydration and food intake.   Contact LC as needed for feeds/support/concerns/questions. All questions answered at this time.    Maternal Data Has patient been taught Hand Expression?: Yes Does the patient have breastfeeding experience prior to this delivery?: No  Feeding Mother's Current Feeding Choice: Breast Milk  LATCH Score Latch: Grasps breast easily, tongue down, lips flanged, rhythmical sucking.  Audible Swallowing: Spontaneous and intermittent  Type of Nipple: Everted at rest and after stimulation  Comfort (Breast/Nipple): Filling, red/small blisters or bruises, mild/mod discomfort  Hold (Positioning): No assistance needed to correctly position infant at breast.  LATCH Score: 9   Lactation Tools Discussed/Used Tools: Pump;Flanges;Coconut oil;Feeding cup Flange Size: 24 Breast pump  type: Manual Reason for Pumping: stimulation and supplementation Pumping frequency: as needed  Interventions Interventions: Breast feeding basics reviewed;Skin to skin;Breast massage;Hand express;Hand pump;Expressed milk;Coconut oil;Education;LC Services brochure  Discharge Discharge Education: Engorgement and breast care;Warning signs for feeding baby Pump: Personal;DEBP;Manual WIC Program: No  Consult Status Consult Status: Complete Date: 06/14/23 Follow-up type: Call as needed    Kimberly [name] 06/14/2023, 2:13 PM

## 2023-06-14 NOTE — Discharge Summary (Signed)
Obstetric Discharge Summary  Kimberly [name] is a 37 y.o. female that presented on 06/12/2023 for scheduled Primary C section for breech, large uterine fibroids. She delivered a viable female infant on 06/12/2023.  Her postpartum course was uncomplicated and on PPD#2, she reported well controlled pain, spontaneous voiding, ambulating without difficulty, and tolerating PO. She had intermittent elevated blood pressures postpartum. She was stable for discharge home on  with plans for in-office follow up.  Hemoglobin  Date Value Ref Range Status  06/13/2023 11.5 (L) 12.0 - 15.0 g/dL Final  16/09/9603 54.0 (L) 12.0 - 15.0 g/dL Final    Comment:    Reticulocyte Hemoglobin testing may be clinically indicated, consider ordering this additional test [id number]    HCT  Date Value Ref Range Status  06/13/2023 35.0 (L) 36.0 - 46.0 % Final    Physical Exam:  General: alert and no distress Lochia: appropriate Uterine Fundus: firm Incision: healing well DVT Evaluation: No evidence of DVT seen on physical exam.  Discharge Diagnoses: Term Pregnancy-delivered  Discharge Information: Date: 06/14/2023 Activity: Pelvic rest, as tolerated Diet: routine Medications: Tylenol, motrin, oxycodone Condition: stable Instructions: Refer to practice specific booklet.  Discussed prior to discharge.  Discharge to: Home  Follow-up Information     [place] Follow up.   Why: Please follow up for a 1 week blood pressure and incision check, followed by a 6 week postpartum visit. Contact information: [address] [place] [address] [phone]                 Newborn Data: Live born female  Birth Weight: 7 lb 9 oz (3430 g) APGAR: 3, 9  Newborn Delivery   Birth date/time: 06/12/2023 08:40:07 Delivery type: C-Section, Low Transverse Trial of labor: No C-section categorization: Primary      Home with mother.  Lyn Henri 06/14/2023, 6:05 PM

## 2023-06-14 NOTE — Progress Notes (Signed)
Postpartum Progress Note  Postpartum Day 2 s/p primary Cesarean section for breech, large uterine fibroids.  Subjective:  Patient reports no overnight events.  She reports well controlled pain, ambulating without difficulty, voiding spontaneously, tolerating PO.  She reports positive flatus.  Vaginal bleeding is minimal.  Objective: Blood pressure 120/82, pulse 73, temperature 98.2 F (36.8 C), temperature source Axillary, resp. rate 17, height 5\' 4"  (1.626 m), weight 115.3 kg, SpO2 99 %, unknown if currently breastfeeding.  Physical Exam:  General: alert and no distress Lochia: appropriate Abdomen: soft, ATTP Uterine Fundus: firm Incision: dressing in place DVT Evaluation: No evidence of DVT seen on physical exam.  Recent Labs    06/13/23 0558  HGB 11.5*  HCT 35.0*     Assessment/Plan: Postpartum Day 1, s/p C-section Baby boy - s/p circ Lactation following Mild range elevated BP previously, now normotensive  Doing well, continue routine postpartum care. Anticipate discharge home today   LOS: 2 days   Lyn Henri 06/14/2023, 8:05 AM

## 2023-06-17 ENCOUNTER — Encounter (HOSPITAL_COMMUNITY): Payer: Self-pay | Admitting: Obstetrics & Gynecology

## 2023-06-17 ENCOUNTER — Observation Stay (HOSPITAL_COMMUNITY)
Admission: AD | Admit: 2023-06-17 | Discharge: 2023-06-19 | Disposition: A | Discharge status: Home / Self Care | Payer: BC Managed Care – PPO | Attending: Obstetrics & Gynecology | Admitting: Obstetrics & Gynecology

## 2023-06-17 ENCOUNTER — Other Ambulatory Visit: Payer: Self-pay

## 2023-06-17 DIAGNOSIS — R519 Headache, unspecified: Secondary | ICD-10-CM | POA: Diagnosis not present

## 2023-06-17 DIAGNOSIS — O165 Unspecified maternal hypertension, complicating the puerperium: Secondary | ICD-10-CM

## 2023-06-17 DIAGNOSIS — O1415 Severe pre-eclampsia, complicating the puerperium: Secondary | ICD-10-CM | POA: Diagnosis not present

## 2023-06-17 DIAGNOSIS — O1003 Pre-existing essential hypertension complicating the puerperium: Secondary | ICD-10-CM | POA: Insufficient documentation

## 2023-06-17 DIAGNOSIS — O1495 Unspecified pre-eclampsia, complicating the puerperium: Secondary | ICD-10-CM

## 2023-06-17 DIAGNOSIS — O9089 Other complications of the puerperium, not elsewhere classified: Secondary | ICD-10-CM

## 2023-06-17 HISTORY — DX: Essential (primary) hypertension: I10

## 2023-06-17 LAB — COMPREHENSIVE METABOLIC PANEL
ALT: 17 U/L (ref 0–44)
AST: 15 U/L (ref 15–41)
Albumin: 2.9 g/dL — ABNORMAL LOW (ref 3.5–5.0)
Alkaline Phosphatase: 74 U/L (ref 38–126)
Anion gap: 8 (ref 5–15)
BUN: 5 mg/dL — ABNORMAL LOW (ref 6–20)
CO2: 24 mmol/L (ref 22–32)
Calcium: 8.4 mg/dL — ABNORMAL LOW (ref 8.9–10.3)
Chloride: 104 mmol/L (ref 98–111)
Creatinine, Ser: 0.71 mg/dL (ref 0.44–1.00)
GFR, Estimated: >60 mL/min (ref >60)
Glucose, Bld: 88 mg/dL (ref 70–99)
Potassium: 3.8 mmol/L (ref 3.5–5.1)
Sodium: 136 mmol/L (ref 135–145)
Total Bilirubin: 0.3 mg/dL (ref 0.3–1.2)
Total Protein: 6.3 g/dL — ABNORMAL LOW (ref 6.5–8.1)

## 2023-06-17 LAB — CBC
HCT: 36.2 % (ref 36.0–46.0)
Hemoglobin: 11.6 g/dL — ABNORMAL LOW (ref 12.0–15.0)
MCH: 27.1 pg (ref 26.0–34.0)
MCHC: 32 g/dL (ref 30.0–36.0)
MCV: 84.6 fL (ref 80.0–100.0)
Platelets: 274 10*3/uL (ref 150–400)
RBC: 4.28 MIL/uL (ref 3.87–5.11)
RDW: 13 % (ref 11.5–15.5)
WBC: 6.7 10*3/uL (ref 4.0–10.5)
nRBC: 0 % (ref 0.0–0.2)

## 2023-06-17 MED ORDER — LABETALOL HCL 5 MG/ML IV SOLN
20.0000 mg | INTRAVENOUS | Status: DC | PRN
  Administered 2023-06-17: 20 mg via INTRAVENOUS
  Filled 2023-06-17: qty 4

## 2023-06-17 MED ORDER — ACETAMINOPHEN-CAFFEINE 500-65 MG PO TABS
1.0000 | ORAL_TABLET | Freq: Once | ORAL | Status: AC
  Administered 2023-06-17: 1 via ORAL
  Filled 2023-06-17: qty 1

## 2023-06-17 MED ORDER — LACTATED RINGERS IV BOLUS
1000.0000 mL | Freq: Once | INTRAVENOUS | Status: AC
  Administered 2023-06-17: 1000 mL via INTRAVENOUS

## 2023-06-17 MED ORDER — LABETALOL HCL 5 MG/ML IV SOLN: 40.0000 mg | INTRAVENOUS | Status: DC | PRN

## 2023-06-17 MED ORDER — LABETALOL HCL 5 MG/ML IV SOLN: 80.0000 mg | INTRAVENOUS | Status: DC | PRN

## 2023-06-17 MED ORDER — MAGNESIUM SULFATE BOLUS VIA INFUSION
6.0000 g | Freq: Once | INTRAVENOUS | Status: AC
  Administered 2023-06-17: 6 g via INTRAVENOUS
  Filled 2023-06-17: qty 1000

## 2023-06-17 MED ORDER — LACTATED RINGERS IV SOLN: INTRAVENOUS | Status: DC

## 2023-06-17 MED ORDER — DOCUSATE SODIUM 100 MG PO CAPS
100.0000 mg | ORAL_CAPSULE | Freq: Every day | ORAL | Status: DC
  Administered 2023-06-18 – 2023-06-19 (×2): 100 mg via ORAL
  Filled 2023-06-17 (×3): qty 1

## 2023-06-17 MED ORDER — CYCLOBENZAPRINE HCL 5 MG PO TABS
10.0000 mg | ORAL_TABLET | Freq: Once | ORAL | Status: DC
  Filled 2023-06-17: qty 2

## 2023-06-17 MED ORDER — HYDRALAZINE HCL 20 MG/ML IJ SOLN: 10.0000 mg | INTRAMUSCULAR | Status: DC | PRN

## 2023-06-17 MED ORDER — SODIUM CHLORIDE 0.9 % IV SOLN: 250.0000 mL | INTRAVENOUS | Status: DC | PRN

## 2023-06-17 MED ORDER — IBUPROFEN 600 MG PO TABS
600.0000 mg | ORAL_TABLET | Freq: Once | ORAL | Status: DC
  Filled 2023-06-17: qty 1

## 2023-06-17 MED ORDER — MAGNESIUM SULFATE 40 GM/1000ML IV SOLN
2.0000 g/h | INTRAVENOUS | Status: AC
  Administered 2023-06-17 – 2023-06-18 (×2): 2 g/h via INTRAVENOUS
  Filled 2023-06-17 (×2): qty 1000

## 2023-06-17 MED ORDER — LABETALOL HCL 200 MG PO TABS
200.0000 mg | ORAL_TABLET | Freq: Three times a day (TID) | ORAL | Status: DC
  Administered 2023-06-17 – 2023-06-18 (×4): 200 mg via ORAL
  Filled 2023-06-17 (×4): qty 1

## 2023-06-17 MED ORDER — SODIUM CHLORIDE 0.9% FLUSH: 3.0000 mL | Freq: Two times a day (BID) | INTRAVENOUS | Status: DC

## 2023-06-17 MED ORDER — SODIUM CHLORIDE 0.9% FLUSH: 3.0000 mL | INTRAVENOUS | Status: DC | PRN

## 2023-06-17 NOTE — MAU Note (Signed)
Dr Langston Masker notified of severe range b/p

## 2023-06-17 NOTE — MAU Note (Signed)
Pt breastfeeding baby after triage RN put her in room. Pt to alert RN when she is finished

## 2023-06-17 NOTE — MAU Note (Signed)
Dr Langston Masker in to see pt and aware of pt's last elevated b/p of 160/82. No new orders.

## 2023-06-17 NOTE — MAU Note (Signed)
.  Kimberly [name] is a 37 y.o. at Unknown here in MAU reporting: Sent here from office for evaluation of blood pressures. She reports she was seen in the office yesterday with elevated blood pressures and they rx her Felodipine. She reports she went to the office today due to elevated blood pressures at home and they told her to come here. Endorses an anterior HA behind her eyes since this past Saturday after being discharged with no relief.   -Felodipine 5 mg Rx yesterday at [facility] office. Took dose yesterday and today. Last dose. -Last took 650 mg of Tylenol and 600 of Ibuprofen at noon today.  Pain score: 7/10 HA  Lab orders placed from triage:  none

## 2023-06-17 NOTE — MAU Provider Note (Signed)
History     CSN: [id number]  Arrival date and time: 06/17/23 1502   Event Date/Time   First Provider Initiated Contact with Patient 06/17/23 1601      Chief Complaint  Patient presents with   Hypertension   HPI Kimberly [name] is a 37 y.o. year old [id number] female s/p primary C/S 06/12/2023 who presents to MAU reporting some mild elevated BPs noted after delivery. Not d/c'd home on HTN meds. She was seen in the office yesterday for a BP check with elevated BPs. She was prescribed Felodipine 5 mg daily. She took a dose yesterday and today. Her BPs were elevated at home and she went back to the [facility] office today. She was told to come to MAU for PEC evaluation. She also reports having a H/A that she feels behind her eyes since Saturday (after d/c home) without relief from Tylenol 650 mg and Ibuprofen 600 mg at noon today. She receives [facility] with [facility]. Her friend is present and contributing to the history taking.  OB History     Gravida  1   Para  1   Term  1   Preterm      AB      Living  1      SAB      IAB      Ectopic      Multiple  0   Live Births  1           Past Medical History:  Diagnosis Date   Anemia    Heart murmur 2018   Diagnosed by PCP - no follow up testing needed   Hypertension     Past Surgical History:  Procedure Laterality Date   CESAREAN SECTION N/A 06/12/2023   Procedure: PRIMARY CESAREAN SECTION EDC: 06-14-23  ALLERG: NKDA;  Surgeon: Mitchel Honour, DO;  Location: [facility] LD ORS;  Service: Obstetrics;  Laterality: N/A;   DILATATION & CURETTAGE/HYSTEROSCOPY WITH MYOSURE N/A 11/14/2020   Procedure: HYSTEROSCOPY, DIALATION AND CURRETAGE WITH  MYOSURE DEVICE;  Surgeon: Mitchel Honour, DO;  Location: [facility];  Service: Gynecology;  Laterality: N/A;   DILATION AND CURETTAGE OF UTERUS      History reviewed. No pertinent family history.  Social History   Tobacco Use   Smoking status: Never   Smokeless tobacco:  Never  Vaping Use   Vaping Use: Never used  Substance Use Topics   Alcohol use: Not Currently    Comment: Socailly   Drug use: No    Allergies: No Known Allergies  Medications Prior to Admission  Medication Sig Dispense Refill Last Dose   acetaminophen (TYLENOL) 325 MG tablet Take 2 tablets (650 mg total) by mouth every 6 (six) hours as needed. 30 tablet 0 06/17/2023 at 1200   docusate sodium (COLACE) 100 MG capsule Take 1 capsule (100 mg total) by mouth daily. 20 capsule 0 06/17/2023   felodipine (PLENDIL) 5 MG 24 hr tablet Take 5 mg by mouth daily.   06/17/2023   ferrous sulfate 325 (65 FE) MG tablet Take 1 tablet (325 mg total) by mouth every other day. 30 tablet 3 06/16/2023   ibuprofen (ADVIL) 600 MG tablet Take 1 tablet (600 mg total) by mouth every 6 (six) hours. 30 tablet 0 06/17/2023 at 1200   Prenatal Vit-Fe Fumarate-FA (PRENATAL VITAMIN AND MINERAL PO) Take 1 tablet by mouth daily.   06/17/2023   oxyCODONE (OXY IR/ROXICODONE) 5 MG immediate release tablet Take 1 tablet (5  mg total) by mouth every 4 (four) hours as needed for moderate pain. 10 tablet 0     Review of Systems  Constitutional: Negative.   HENT: Negative.    Eyes: Negative.   Respiratory: Negative.    Cardiovascular: Negative.   Gastrointestinal: Negative.   Endocrine: Negative.   Genitourinary:  Positive for vaginal bleeding.  Musculoskeletal: Negative.   Skin:  Positive for wound (C/S incision).  Allergic/Immunologic: Negative.   Neurological:  Positive for headaches ("behind eyes").  Hematological: Negative.   Psychiatric/Behavioral: Negative.     Physical Exam  Patient Vitals for the past 24 hrs:  BP Temp Temp src Pulse Resp SpO2 Height Weight  06/17/23 1951 (!) 147/69 -- -- (!) 59 -- -- -- --  06/17/23 1949 -- -- -- -- -- 99 % -- --  06/17/23 1944 -- -- -- -- -- 98 % -- --  06/17/23 1939 -- -- -- -- -- 97 % -- --  06/17/23 1934 (!) 158/76 -- -- 66 -- 97 % -- --  06/17/23 1904 -- -- -- -- -- 99 % -- --   06/17/23 1901 (!) 160/82 -- -- 66 -- -- -- --  06/17/23 1859 -- -- -- -- -- 98 % -- --  06/17/23 1854 -- -- -- -- -- 98 % -- --  06/17/23 1849 -- -- -- -- -- 98 % -- --  06/17/23 1846 (!) 131/90 -- -- 82 -- -- -- --  06/17/23 1844 -- -- -- -- -- 99 % -- --  06/17/23 1839 -- -- -- -- -- 98 % -- --  06/17/23 1834 (!) 142/69 -- -- 65 -- 99 % -- --  06/17/23 1731 (!) 167/72 -- -- (!) 53 -- -- -- --  06/17/23 1729 -- -- -- -- -- 98 % -- --  06/17/23 1724 -- -- -- -- -- 98 % -- --  06/17/23 1719 -- -- -- -- -- 99 % -- --  06/17/23 1716 (!) 152/75 -- -- (!) 51 -- -- -- --  06/17/23 1714 -- -- -- -- -- 99 % -- --  06/17/23 1709 -- -- -- -- -- 98 % -- --  06/17/23 1704 -- -- -- -- -- 98 % -- --  06/17/23 1701 (!) 150/79 -- -- (!) 54 -- -- -- --  06/17/23 1659 -- -- -- -- -- 98 % -- --  06/17/23 1654 -- -- -- -- -- 99 % -- --  06/17/23 1649 -- -- -- -- -- 99 % -- --  06/17/23 1646 (!) 147/78 -- -- (!) 58 -- -- -- --  06/17/23 1644 -- -- -- -- -- 98 % -- --  06/17/23 1634 -- -- -- -- -- 99 % -- --  06/17/23 1631 123/89 -- -- 60 -- -- -- --  06/17/23 1629 -- -- -- -- -- 99 % -- --  06/17/23 1624 -- -- -- -- -- 98 % -- --  06/17/23 1619 -- -- -- -- -- 98 % -- --  06/17/23 1616 (!) 144/80 -- -- (!) 58 -- -- -- --  06/17/23 1614 -- -- -- -- -- 98 % -- --  06/17/23 1609 -- -- -- -- -- 98 % -- --  06/17/23 1604 -- -- -- -- -- 98 % -- --  06/17/23 1601 (!) 148/73 -- -- (!) 58 -- -- -- --  06/17/23 1559 -- -- -- -- -- 98 % -- --  06/17/23 1554 -- -- -- -- --  97 % -- --  06/17/23 1549 -- -- -- -- -- 98 % -- --  06/17/23 1544 (!) 146/69 -- -- 65 -- 97 % -- --  06/17/23 1516 (!) 148/73 98.3 F (36.8 C) Oral 61 18 100 % 5\' 4"  (1.626 m) 112.4 kg    Physical Exam Vitals and nursing note reviewed.  Constitutional:      Appearance: Normal appearance. She is obese.  HENT:     Head: Normocephalic and atraumatic.  Cardiovascular:     Rate and Rhythm: Normal rate.  Pulmonary:     Effort:  Pulmonary effort is normal.  Musculoskeletal:        General: Normal range of motion.     Right lower leg: Edema (trace) present.     Left lower leg: Edema (trace) present.  Skin:    General: Skin is warm and dry.  Neurological:     Mental Status: She is alert and oriented to person, place, and time.  Psychiatric:        Mood and Affect: Mood normal.        Behavior: Behavior normal.        Thought Content: Thought content normal.        Judgment: Judgment normal.    MAU Course  Procedures  MDM CCUA CBC CMP Serial BP's  LR bolus Excedrin Tension Headache 1 tablet -- no relief Regular Diet Flexeril 10 mg -- patient declined Ibuprofen 600 mg -- patient declined  *Consult with Dr. Donavan Foil @ [phone] - notified of patient's complaints, assessments, lab results, recommend tx plan if H/A not improved after tx, call [facility] MD on-call with recommendation of readmission for MgSO4  TC to Dr. Shawnie Pons @ [phone]: reporting patient's H/A is not any better - ok to call [facility] with recommendation for readmission  TC to Dr. Langston Masker @ [phone] with recommendation of readmission for MgSO4 for intractable H/A - agrees with recommendation - will put in orders   Results for orders placed or performed during the hospital encounter of 06/17/23 (from the past 24 hour(s))  CBC     Status: Abnormal   Collection Time: 06/17/23  3:48 PM  Result Value Ref Range   WBC 6.7 4.0 - 10.5 K/uL   RBC 4.28 3.87 - 5.11 MIL/uL   Hemoglobin 11.6 (L) 12.0 - 15.0 g/dL   HCT 56.2 13.0 - 86.5 %   MCV 84.6 80.0 - 100.0 fL   MCH 27.1 26.0 - 34.0 pg   MCHC 32.0 30.0 - 36.0 g/dL   RDW 78.4 69.6 - 29.5 %   Platelets 274 150 - 400 K/uL   nRBC 0.0 0.0 - 0.2 %  Comprehensive metabolic panel     Status: Abnormal   Collection Time: 06/17/23  3:48 PM  Result Value Ref Range   Sodium 136 135 - 145 mmol/L   Potassium 3.8 3.5 - 5.1 mmol/L   Chloride 104 98 - 111 mmol/L   CO2 24 22 - 32 mmol/L   Glucose, Bld 88 70 - 99 mg/dL   BUN 5 (L) 6 - 20  mg/dL   Creatinine, Ser 2.84 0.44 - 1.00 mg/dL   Calcium 8.4 (L) 8.9 - 10.3 mg/dL   Total Protein 6.3 (L) 6.5 - 8.1 g/dL   Albumin 2.9 (L) 3.5 - 5.0 g/dL   AST 15 15 - 41 U/L   ALT 17 0 - 44 U/L   Alkaline Phosphatase 74 38 - 126 U/L   Total Bilirubin 0.3 0.3 - 1.2  mg/dL   GFR, Estimated >09 >81 mL/min   Anion gap 8 5 - 15      Assessment and Plan  1. Postpartum headache  2. Postpartum hypertension - Admit to OBSCU - Orders placed by Dr. Langston Masker - See Dr. Langston Masker' H&P documentation   Raelyn Mora, CNM 06/17/2023, 4:01 PM

## 2023-06-17 NOTE — H&P (Signed)
Kimberly [name] is a 37 y.o. female G1P1 POD5 s/p primary C/S for breech presentation presenting for elevated BPs and headache.  Patient has h/o CHTN but did not require medication during pregnancy.  Patient was discharged home on postop day 2 after uncomplicated hospital course.  A few mild range BPs were noted prior to d/c but she was not sent home on medication.  Patient called in to the office with elevated BPs yesterday and was started on felodipine 5 mg.  She called back today and BPs were still elevated.  On MAU presentation, BPs are persistently high mild range and she has HA which is severe and unresponsive to Tylenol, Excedrin migraine, and Flexeril.  She did require IV labetalol 20 mg x 1 dose.  Pre-E labs are wnl.    OB History     Gravida  1   Para  1   Term  1   Preterm      AB      Living  1      SAB      IAB      Ectopic      Multiple  0   Live Births  1          Past Medical History:  Diagnosis Date   Anemia    Heart murmur 2018   Diagnosed by PCP - no follow up testing needed   Hypertension    Past Surgical History:  Procedure Laterality Date   CESAREAN SECTION N/A 06/12/2023   Procedure: PRIMARY CESAREAN SECTION EDC: 06-14-23  ALLERG: NKDA;  Surgeon: Mitchel Honour, DO;  Location: [facility];  Service: Obstetrics;  Laterality: N/A;   DILATATION & CURETTAGE/HYSTEROSCOPY WITH MYOSURE N/A 11/14/2020   Procedure: HYSTEROSCOPY, DIALATION AND CURRETAGE WITH  MYOSURE DEVICE;  Surgeon: Mitchel Honour, DO;  Location: [facility];  Service: Gynecology;  Laterality: N/A;   DILATION AND CURETTAGE OF UTERUS     Family History: family history is not on file. Social History:  reports that she has never smoked. She has never used smokeless tobacco. She reports that she does not currently use alcohol. She reports that she does not use drugs.  Review of Systems History   Blood pressure (!) 147/69, pulse (!) 59, temperature 98.3 F (36.8 C), temperature  source Oral, resp. rate 18, height 5\' 4"  (1.626 m), weight 112.4 kg, SpO2 99 %, currently breastfeeding. Maternal Exam:  Abdomen: Patient reports no abdominal tenderness. Surgical scars: low transverse.     Physical Exam Constitutional:      Appearance: Normal appearance.  HENT:     Head: Normocephalic and atraumatic.  Pulmonary:     Effort: Pulmonary effort is normal.  Abdominal:     Palpations: Abdomen is soft.     Comments: Well healing Pfannenstiel with steri-strips in place  Musculoskeletal:        General: Normal range of motion.     Cervical back: Normal range of motion.     Right lower leg: Edema present.     Left lower leg: Edema present.  Skin:    General: Skin is warm and dry.  Neurological:     Mental Status: She is alert and oriented to person, place, and time.     Deep Tendon Reflexes: Reflexes normal.  Psychiatric:        Mood and Affect: Mood normal.        Behavior: Behavior normal.        Latest Ref Rng &  Units 06/17/2023    3:48 PM 06/13/2023    5:58 AM 06/10/2023    8:58 AM  CBC  WBC 4.0 - 10.5 K/uL 6.7  10.2  7.7   Hemoglobin 12.0 - 15.0 g/dL 16.1  09.6  04.5   Hematocrit 36.0 - 46.0 % 36.2  35.0  39.0   Platelets 150 - 400 K/uL 274  185  215       Latest Ref Rng & Units 06/17/2023    3:48 PM 12/23/2022    2:09 PM 11/03/2020    8:37 AM  CMP  Glucose 70 - 99 mg/dL 88  83  98   BUN 6 - 20 mg/dL 5  7  9    Creatinine 0.44 - 1.00 mg/dL 4.09  8.11  9.14   Sodium 135 - 145 mmol/L 136  132  138   Potassium 3.5 - 5.1 mmol/L 3.8  3.9  4.6   Chloride 98 - 111 mmol/L 104  103  106   CO2 22 - 32 mmol/L 24  23  26    Calcium 8.9 - 10.3 mg/dL 8.4  9.2  9.0   Total Protein 6.5 - 8.1 g/dL 6.3  7.1  7.1   Total Bilirubin 0.3 - 1.2 mg/dL 0.3  0.2  0.5   Alkaline Phos 38 - 126 U/L 74  68  77   AST 15 - 41 U/L 15  10  12    ALT 0 - 44 U/L 17  9  14       Assessment/Plan: 36yo G1P1 POD#5 with postpartum super-imposed pre-eclampsia -Admit to ante for obs -Mag  6/2 -Start labetalol 200 TID -AM labs -SCDs    Mitchel Honour 06/17/2023, 8:08 PM

## 2023-06-18 LAB — COMPREHENSIVE METABOLIC PANEL
ALT: 16 U/L (ref 0–44)
AST: 13 U/L — ABNORMAL LOW (ref 15–41)
Albumin: 2.5 g/dL — ABNORMAL LOW (ref 3.5–5.0)
Alkaline Phosphatase: 71 U/L (ref 38–126)
Anion gap: 6 (ref 5–15)
BUN: 7 mg/dL (ref 6–20)
CO2: 24 mmol/L (ref 22–32)
Calcium: 7.7 mg/dL — ABNORMAL LOW (ref 8.9–10.3)
Chloride: 106 mmol/L (ref 98–111)
Creatinine, Ser: 0.66 mg/dL (ref 0.44–1.00)
GFR, Estimated: >60 mL/min (ref >60)
Glucose, Bld: 102 mg/dL — ABNORMAL HIGH (ref 70–99)
Potassium: 3.5 mmol/L (ref 3.5–5.1)
Sodium: 136 mmol/L (ref 135–145)
Total Bilirubin: 0.4 mg/dL (ref 0.3–1.2)
Total Protein: 5.8 g/dL — ABNORMAL LOW (ref 6.5–8.1)

## 2023-06-18 LAB — CBC
HCT: 33.7 % — ABNORMAL LOW (ref 36.0–46.0)
Hemoglobin: 10.8 g/dL — ABNORMAL LOW (ref 12.0–15.0)
MCH: 27.8 pg (ref 26.0–34.0)
MCHC: 32 g/dL (ref 30.0–36.0)
MCV: 86.6 fL (ref 80.0–100.0)
Platelets: 256 10*3/uL (ref 150–400)
RBC: 3.89 MIL/uL (ref 3.87–5.11)
RDW: 13.2 % (ref 11.5–15.5)
WBC: 6.2 10*3/uL (ref 4.0–10.5)
nRBC: 0 % (ref 0.0–0.2)

## 2023-06-18 MED ORDER — ASPIRIN-ACETAMINOPHEN-CAFFEINE 250-250-65 MG PO TABS
1.0000 | ORAL_TABLET | Freq: Once | ORAL | Status: AC
  Administered 2023-06-18: 1 via ORAL
  Filled 2023-06-18: qty 1

## 2023-06-18 MED ORDER — OXYCODONE HCL 5 MG PO TABS: 5.0000 mg | ORAL_TABLET | ORAL | Status: DC | PRN

## 2023-06-18 MED ORDER — ACETAMINOPHEN 325 MG PO TABS
650.0000 mg | ORAL_TABLET | Freq: Four times a day (QID) | ORAL | Status: DC
  Administered 2023-06-18 – 2023-06-19 (×4): 650 mg via ORAL
  Filled 2023-06-18 (×4): qty 2

## 2023-06-18 MED ORDER — IBUPROFEN 600 MG PO TABS
600.0000 mg | ORAL_TABLET | Freq: Four times a day (QID) | ORAL | Status: DC
  Administered 2023-06-18 – 2023-06-19 (×5): 600 mg via ORAL
  Filled 2023-06-18 (×5): qty 1

## 2023-06-18 NOTE — Progress Notes (Signed)
Postpartum Progress Note  Postpartum Day 6 s/p primary Cesarean section, readmitted 06/17/23 for postpartum preeclampsia.  She presented from office with severe range BP and headache.  She received 1x IV labetalol in MAU and was started on labetalol 200 mg TID.  Subjective:  Patient reports no overnight events.  She is tolerating magnesium well and was able to rest overnight. Her headache is resolved.  Denies vision changes, RUQ or epigastric pain.  Postoperative pain is well controlled.   Objective: Blood pressure 139/73, pulse 77, temperature 98.3 F (36.8 C), temperature source Oral, resp. rate 18, height 5\' 4"  (1.626 m), weight 112.4 kg, SpO2 98 %, currently breastfeeding.  Physical Exam:  General: alert and no distress Lochia: appropriate Abdomen: soft, ATTP Uterine Fundus: firm Incision: clean/dry/intact DVT Evaluation: No evidence of DVT seen on physical exam.  Recent Labs    06/17/23 1548 06/18/23 0451  HGB 11.6* 10.8*  HCT 36.2 33.7*    Assessment/Plan: Postpartum Day 6, s/p C-section, admitted for postpartum preeclampsia Magnesium infusion for 24 hrs, due to expire this evening BP normal range and mild range on labetalol. Will continue 200 mg TID, titrate as needed.  Doing well postoperatively.  Dispo: continue PP magnesium.  Will reassess BP and headache following discontinuation of magnesium and likely discharge in AM.    LOS: 0 days   Lyn Henri 06/18/2023, 7:30 AM

## 2023-06-19 MED ORDER — LABETALOL HCL 200 MG PO TABS: 400.0000 mg | ORAL_TABLET | Freq: Three times a day (TID) | ORAL | 1 refills | Status: AC

## 2023-06-19 MED ORDER — LABETALOL HCL 200 MG PO TABS: 400.0000 mg | ORAL_TABLET | Freq: Three times a day (TID) | ORAL | 1 refills | Status: DC

## 2023-06-19 MED ORDER — LABETALOL HCL 200 MG PO TABS
400.0000 mg | ORAL_TABLET | Freq: Three times a day (TID) | ORAL | Status: DC
  Administered 2023-06-19: 400 mg via ORAL
  Filled 2023-06-19: qty 2

## 2023-06-19 NOTE — Progress Notes (Signed)
   06/19/23 1118  Departure Condition  Departure Condition Good  Mobility at [facility]  Patient/Caregiver Teaching Teach Back Method Used;Discharge instructions reviewed;Prescriptions reviewed;Follow-up care reviewed;Pain management discussed;Medications discussed;Patient/caregiver verbalized understanding;Educated about hypertension in pregnancy  Departure Mode With family  Was procedural sedation performed on this patient during this visit? No   Patient alert and oriented x4, and VS and pain stable at discharge.

## 2023-06-19 NOTE — Discharge Summary (Signed)
Physician Discharge Summary  Patient ID: Kimberly [name] MRN: [id number] DOB/AGE: 36-01-1986 36 y.o.  Admit date: 06/17/2023 Discharge date: 06/19/2023  Admission Diagnoses: Postpartum severe preeclampsia  Discharge Diagnoses:  Principal Problem:   Pre-eclampsia in postpartum period  Discharged Condition: good  Hospital Course:  Patient was admitted on POD3 for postpartum superimposed severe preeclampsia - she was started on magnesium for 24 hours and oral antihypertensives. Her blood pressures were much better controlled and her HA resolved. After discontinuation of magnesium, her Bps uptrended and her labetalol dose was increased to 400mg  TID. She remained asymptomatic and was discharged home in stable condition. She has a BP and incision check tomorrow in the office. Precautions reviewed. She will check her Bps at home.  Consults: None  Significant Diagnostic Studies: labs:  Recent Results (from the past 2160 hour(s))  OB RESULT CONSOLE Group B Strep     Status: None   Collection Time: 05/22/23 12:00 AM  Result Value Ref Range   GBS Negative   CBC     Status: None   Collection Time: 06/10/23  8:58 AM  Result Value Ref Range   WBC 7.7 4.0 - 10.5 K/uL   RBC 4.48 3.87 - 5.11 MIL/uL   Hemoglobin 12.8 12.0 - 15.0 g/dL   HCT 40.9 81.1 - 91.4 %   MCV 87.1 80.0 - 100.0 fL   MCH 28.6 26.0 - 34.0 pg   MCHC 32.8 30.0 - 36.0 g/dL   RDW 78.2 95.6 - 21.3 %   Platelets 215 150 - 400 K/uL   nRBC 0.0 0.0 - 0.2 %    Comment: Performed at [facility], [address] [place], [place] [address]  RPR     Status: None   Collection Time: 06/10/23  8:58 AM  Result Value Ref Range   RPR Ser Ql NON REACTIVE NON REACTIVE    Comment: Performed at [facility], [address], [place], [place] [address]  Type and screen [facility]     Status: None   Collection Time: 06/10/23  9:08 AM  Result Value Ref Range   ABO/RH(D) O POS    Antibody Screen NEG    Sample Expiration  06/13/2023,2359    Unit Number [id number]    Blood Component Type RBC LR PHER2    Unit division 00    Status of Unit REL FROM [facility]    Transfusion Status OK TO TRANSFUSE    Crossmatch Result      Compatible Performed at [facility], [address], [place], [place] [address]    Unit Number [id number]    Blood Component Type RBC LR PHER1    Unit division 00    Status of Unit REL FROM [facility]    Transfusion Status OK TO TRANSFUSE    Crossmatch Result Compatible   BPAM RBC     Status: None   Collection Time: 06/10/23  9:08 AM  Result Value Ref Range   ISSUE DATE / TIME [id number]    Blood Product Unit Number [id number]    PRODUCT CODE S0109N23    Unit Type and Rh 5100    Blood Product Expiration Date [id number]    ISSUE DATE / TIME 062376283151    Blood Product Unit Number [id number]    PRODUCT CODE R4854O27    Unit Type and Rh 5100    Blood Product Expiration Date [id number]   Prepare RBC (crossmatch)     Status: None  Collection Time: 06/12/23  6:34 AM  Result Value Ref Range   Order Confirmation      ORDER PROCESSED BY BLOOD BANK Performed at [facility], [address] [place], [place] [address]   CBC     Status: Abnormal   Collection Time: 06/13/23  5:58 AM  Result Value Ref Range   WBC 10.2 4.0 - 10.5 K/uL   RBC 4.14 3.87 - 5.11 MIL/uL   Hemoglobin 11.5 (L) 12.0 - 15.0 g/dL   HCT 60.4 (L) 54.0 - 98.1 %   MCV 84.5 80.0 - 100.0 fL   MCH 27.8 26.0 - 34.0 pg   MCHC 32.9 30.0 - 36.0 g/dL   RDW 19.1 47.8 - 29.5 %   Platelets 185 150 - 400 K/uL   nRBC 0.0 0.0 - 0.2 %    Comment: Performed at [facility], [address], [place], [place] [address]  CBC     Status: Abnormal   Collection Time: 06/17/23  3:48 PM  Result Value Ref Range   WBC 6.7 4.0 - 10.5 K/uL   RBC 4.28 3.87 - 5.11 MIL/uL   Hemoglobin 11.6 (L) 12.0 - 15.0 g/dL   HCT 86.5 78.4 - 69.6 %   MCV 84.6 80.0 - 100.0 fL   MCH 27.1 26.0 - 34.0 pg   MCHC 32.0 30.0 -  36.0 g/dL   RDW 29.5 28.4 - 13.2 %   Platelets 274 150 - 400 K/uL   nRBC 0.0 0.0 - 0.2 %    Comment: Performed at [facility], [address] [place], [place] [address]  Comprehensive metabolic panel     Status: Abnormal   Collection Time: 06/17/23  3:48 PM  Result Value Ref Range   Sodium 136 135 - 145 mmol/L   Potassium 3.8 3.5 - 5.1 mmol/L   Chloride 104 98 - 111 mmol/L   CO2 24 22 - 32 mmol/L   Glucose, Bld 88 70 - 99 mg/dL    Comment: Glucose reference range applies only to samples taken after fasting for at least 8 hours.   BUN 5 (L) 6 - 20 mg/dL   Creatinine, Ser 2.72 0.44 - 1.00 mg/dL   Calcium 8.4 (L) 8.9 - 10.3 mg/dL   Total Protein 6.3 (L) 6.5 - 8.1 g/dL   Albumin 2.9 (L) 3.5 - 5.0 g/dL   AST 15 15 - 41 U/L   ALT 17 0 - 44 U/L   Alkaline Phosphatase 74 38 - 126 U/L   Total Bilirubin 0.3 0.3 - 1.2 mg/dL   GFR, Estimated >53 >66 mL/min    Comment: (NOTE) Calculated using the CKD-EPI Creatinine Equation (2021)    Anion gap 8 5 - 15    Comment: Performed at [facility], [address] [place], [place] [address]  Comprehensive metabolic panel     Status: Abnormal   Collection Time: 06/18/23  4:51 AM  Result Value Ref Range   Sodium 136 135 - 145 mmol/L   Potassium 3.5 3.5 - 5.1 mmol/L   Chloride 106 98 - 111 mmol/L   CO2 24 22 - 32 mmol/L   Glucose, Bld 102 (H) 70 - 99 mg/dL    Comment: Glucose reference range applies only to samples taken after fasting for at least 8 hours.   BUN 7 6 - 20 mg/dL   Creatinine, Ser 7.42 0.44 - 1.00 mg/dL   Calcium 7.7 (L) 8.9 - 10.3 mg/dL   Total  Protein 5.8 (L) 6.5 - 8.1 g/dL   Albumin 2.5 (L) 3.5 - 5.0 g/dL   AST 13 (L) 15 - 41 U/L   ALT 16 0 - 44 U/L   Alkaline Phosphatase 71 38 - 126 U/L   Total Bilirubin 0.4 0.3 - 1.2 mg/dL   GFR, Estimated >16 >10 mL/min    Comment: (NOTE) Calculated using the CKD-EPI Creatinine Equation (2021)    Anion gap 6 5 - 15    Comment: Performed at [facility], [address], [place], [place] [address]  CBC     Status: Abnormal   Collection Time: 06/18/23  4:51 AM  Result Value Ref Range   WBC 6.2 4.0 - 10.5 K/uL   RBC 3.89 3.87 - 5.11 MIL/uL   Hemoglobin 10.8 (L) 12.0 - 15.0 g/dL   HCT 40.9 (L) 81.1 - 91.4 %   MCV 86.6 80.0 - 100.0 fL   MCH 27.8 26.0 - 34.0 pg   MCHC 32.0 30.0 - 36.0 g/dL   RDW 78.2 95.6 - 21.3 %   Platelets 256 150 - 400 K/uL   nRBC 0.0 0.0 - 0.2 %    Comment: Performed at [facility], [address], [place], [place] [address]     Treatments: IV magnesium and oral antihypertensives  Discharge Exam: Blood pressure (!) 158/87, pulse 68, temperature 97.6 F (36.4 C), temperature source Oral, resp. rate 19, height 5\' 4"  (1.626 m), weight 112.4 kg, SpO2 98 %, currently breastfeeding. General appearance: alert, cooperative, and no distress Resp: no increased work of breathing Cardio: regular rate GI: soft, non-tender; bowel sounds normal; no masses,  no organomegaly Extremities: trace edema bilaterally Incision/Wound: c/d/i  Disposition: Discharge disposition: 01-Home or Self Care       Discharge Instructions      Remove dressing in 72 hours   Complete by: As directed    Activity as tolerated   Complete by: As directed    No heavy lifting , no driving for 2 weeks, and no vaginal entry for 6 weeks.   Call MD for:  difficulty breathing, headache or visual disturbances   Complete by: As directed    Call MD for:  hives   Complete by: As directed    Call MD for:  persistant dizziness or light-headedness   Complete by: As directed    Call MD for:  persistant nausea and vomiting   Complete by: As directed    Call MD for:  redness, tenderness, or signs of infection (pain, swelling, redness, odor or green/yellow discharge around incision site)   Complete by: As directed    Call MD for:  severe uncontrolled pain   Complete by: As directed    Call MD for:  temperature >100.4   Complete by: As directed    Diet general    Complete by: As directed    Discharge instructions   Complete by: As directed    Post partum brochure   Driving restriction    Complete by: As directed    Avoid driving for at least 2 weeks.   Lifting restrictions   Complete by: As directed    Weight restriction of 10 lbs.      Allergies as of 06/19/2023   No Known Allergies      Medication List     STOP taking these medications    ferrous sulfate 325 (65 FE) MG tablet       TAKE these medications  acetaminophen 325 MG tablet Commonly known as: Tylenol Take 2 tablets (650 mg total) by mouth every 6 (six) hours as needed.   docusate sodium 100 MG capsule Commonly known as: Colace Take 1 capsule (100 mg total) by mouth daily.   felodipine 5 MG 24 hr tablet Commonly known as: PLENDIL Take 5 mg by mouth daily.   ibuprofen 600 MG tablet Commonly known as: ADVIL Take 1 tablet (600 mg total) by mouth every 6 (six) hours.   labetalol 200 MG tablet Commonly known as: NORMODYNE Take 2 tablets (400 mg total) by mouth 3 (three) times daily.   oxyCODONE 5 MG immediate release tablet Commonly known as: Oxy IR/ROXICODONE Take 1 tablet (5 mg total) by mouth every 4 (four) hours as needed for moderate pain.   PRENATAL VITAMIN AND MINERAL PO Take 1 tablet by mouth daily.         Signed: Tawni Levy 06/19/2023, 9:41 AM

## 2023-06-19 NOTE — Plan of Care (Signed)
  Problem: Education: Goal: Knowledge of the prescribed therapeutic regimen will improve Outcome: Adequate for Discharge Goal: Understanding of sexual limitations or changes related to disease process or condition will improve Outcome: Adequate for Discharge Goal: Individualized Educational Video(s) Outcome: Adequate for Discharge   Problem: Self-Concept: Goal: Communication of feelings regarding changes in body function or appearance will improve Outcome: Adequate for Discharge   Problem: Skin Integrity: Goal: Demonstration of wound healing without infection will improve Outcome: Adequate for Discharge   Problem: Education: Goal: Knowledge of condition will improve Outcome: Adequate for Discharge Goal: Individualized Educational Video(s) Outcome: Adequate for Discharge Goal: Individualized Newborn Educational Video(s) Outcome: Adequate for Discharge   Problem: Activity: Goal: Will verbalize the importance of balancing activity with adequate rest periods Outcome: Adequate for Discharge Goal: Ability to tolerate increased activity will improve Outcome: Adequate for Discharge   Problem: Coping: Goal: Ability to identify and utilize available resources and services will improve Outcome: Adequate for Discharge   Problem: Life Cycle: Goal: Chance of risk for complications during the postpartum period will decrease Outcome: Adequate for Discharge   Problem: Role Relationship: Goal: Ability to demonstrate positive interaction with newborn will improve Outcome: Adequate for Discharge   Problem: Skin Integrity: Goal: Demonstration of wound healing without infection will improve Outcome: Adequate for Discharge   Problem: Education: Goal: Knowledge of disease or condition will improve Outcome: Adequate for Discharge Goal: Knowledge of the prescribed therapeutic regimen will improve Outcome: Adequate for Discharge   Problem: Fluid Volume: Goal: Peripheral tissue perfusion  will improve Outcome: Adequate for Discharge   Problem: Clinical Measurements: Goal: Complications related to disease process, condition or treatment will be avoided or minimized Outcome: Adequate for Discharge   Problem: Education: Goal: Knowledge of General Education information will improve Description: Including pain rating scale, medication(s)/side effects and non-pharmacologic comfort measures Outcome: Adequate for Discharge   Problem: Health Behavior/Discharge Planning: Goal: Ability to manage health-related needs will improve Outcome: Adequate for Discharge   Problem: Clinical Measurements: Goal: Ability to maintain clinical measurements within normal limits will improve Outcome: Adequate for Discharge Goal: Will remain free from infection Outcome: Adequate for Discharge Goal: Diagnostic test results will improve Outcome: Adequate for Discharge Goal: Respiratory complications will improve Outcome: Adequate for Discharge Goal: Cardiovascular complication will be avoided Outcome: Adequate for Discharge   Problem: Activity: Goal: Risk for activity intolerance will decrease Outcome: Adequate for Discharge   Problem: Nutrition: Goal: Adequate nutrition will be maintained Outcome: Adequate for Discharge   Problem: Coping: Goal: Level of anxiety will decrease Outcome: Adequate for Discharge   Problem: Elimination: Goal: Will not experience complications related to bowel motility Outcome: Adequate for Discharge Goal: Will not experience complications related to urinary retention Outcome: Adequate for Discharge   Problem: Pain Managment: Goal: General experience of comfort will improve Outcome: Adequate for Discharge   Problem: Safety: Goal: Ability to remain free from injury will improve Outcome: Adequate for Discharge   Problem: Skin Integrity: Goal: Risk for impaired skin integrity will decrease Outcome: Adequate for Discharge

## 2023-06-20 ENCOUNTER — Other Ambulatory Visit (HOSPITAL_COMMUNITY): Payer: Self-pay

## 2023-06-20 MED ORDER — LABETALOL HCL 200 MG PO TABS
400.0000 mg | ORAL_TABLET | Freq: Three times a day (TID) | ORAL | 1 refills | Status: AC
  Filled 2023-06-20: qty 90, 30d supply, fill #0

## 2023-07-09 ENCOUNTER — Telehealth (HOSPITAL_COMMUNITY): Payer: Self-pay | Admitting: *Deleted

## 2023-07-09 NOTE — Telephone Encounter (Signed)
07/09/2023  Name: Kimberly [name] MRN: [id number] DOB: 1986-11-03  Reason for Call:  Transition of Care Hospital Discharge Call  Contact Status: Patient Contact Status: Complete  Language assistant needed:          Follow-Up Questions: Do You Have Any Concerns About Your Health As You Heal From Delivery?: No Do You Have Any Concerns About Your Infants Health?: No  Edinburgh Postnatal Depression Scale:  In the Past 7 Days: I have been able to laugh and see the funny side of things.: As much as I always could I have looked forward with enjoyment to things.: As much as I ever did I have blamed myself unnecessarily when things went wrong.: No, never I have been anxious or worried for no good reason.: No, not at all I have felt scared or panicky for no good reason.: No, not at all Things have been getting on top of me.: No, I have been coping as well as ever I have been so unhappy that I have had difficulty sleeping.: Not at all I have felt sad or miserable.: No, not at all I have been so unhappy that I have been crying.: No, never The thought of harming myself has occurred to me.: Never [name] Postnatal Depression Scale Total: 0  PHQ2-9 Depression Scale:     Discharge Follow-up: Edinburgh score requires follow up?: No Patient was advised of the following resources:: Breastfeeding Support Group, Support Group  Post-discharge interventions: Reviewed Newborn Safe Sleep Practices  Signature Deforest Hoyles, RN, [phone]
